# Patient Record
Sex: Female | Born: 1945
Health system: Southern US, Community
[De-identification: ages and names within clinical notes are randomized; demographics above are authoritative.]

## PROBLEM LIST (undated history)

## (undated) DIAGNOSIS — H269 Unspecified cataract: Secondary | ICD-10-CM

## (undated) DIAGNOSIS — K625 Hemorrhage of anus and rectum: Secondary | ICD-10-CM

## (undated) DIAGNOSIS — E785 Hyperlipidemia, unspecified: Secondary | ICD-10-CM

## (undated) DIAGNOSIS — R040 Epistaxis: Secondary | ICD-10-CM

## (undated) DIAGNOSIS — I10 Essential (primary) hypertension: Secondary | ICD-10-CM

## (undated) DIAGNOSIS — E119 Type 2 diabetes mellitus without complications: Secondary | ICD-10-CM

## (undated) HISTORY — DX: Epistaxis: R04.0

## (undated) HISTORY — PX: ABDOMINAL SURGERY: SHX537

## (undated) HISTORY — DX: Hyperlipidemia, unspecified: E78.5

## (undated) HISTORY — PX: ABDOMINAL HYSTERECTOMY: SHX81

## (undated) HISTORY — DX: Hemorrhage of anus and rectum: K62.5

## (undated) HISTORY — DX: Unspecified cataract: H26.9

---

## 1997-08-02 ENCOUNTER — Emergency Department (HOSPITAL_COMMUNITY): Admission: EM | Admit: 1997-08-02 | Discharge: 1997-08-02 | Payer: Self-pay | Admitting: Emergency Medicine

## 1998-11-17 ENCOUNTER — Encounter: Payer: Self-pay | Admitting: *Deleted

## 1998-11-18 ENCOUNTER — Encounter: Payer: Self-pay | Admitting: Family Medicine

## 1998-11-18 ENCOUNTER — Inpatient Hospital Stay (HOSPITAL_COMMUNITY): Admission: EM | Admit: 1998-11-18 | Discharge: 1998-11-19 | Payer: Self-pay | Admitting: *Deleted

## 1998-11-23 ENCOUNTER — Encounter: Admission: RE | Admit: 1998-11-23 | Discharge: 1998-11-23 | Payer: Self-pay | Admitting: Family Medicine

## 1998-11-24 ENCOUNTER — Emergency Department (HOSPITAL_COMMUNITY): Admission: EM | Admit: 1998-11-24 | Discharge: 1998-11-24 | Payer: Self-pay | Admitting: Emergency Medicine

## 1998-11-25 ENCOUNTER — Encounter: Admission: RE | Admit: 1998-11-25 | Discharge: 1998-11-25 | Payer: Self-pay | Admitting: Family Medicine

## 1998-12-10 ENCOUNTER — Encounter: Admission: RE | Admit: 1998-12-10 | Discharge: 1998-12-10 | Payer: Self-pay | Admitting: Family Medicine

## 1999-01-08 ENCOUNTER — Encounter (INDEPENDENT_AMBULATORY_CARE_PROVIDER_SITE_OTHER): Payer: Self-pay | Admitting: *Deleted

## 1999-01-08 LAB — CONVERTED CEMR LAB

## 1999-02-05 ENCOUNTER — Encounter: Admission: RE | Admit: 1999-02-05 | Discharge: 1999-02-05 | Payer: Self-pay | Admitting: Family Medicine

## 1999-10-01 ENCOUNTER — Encounter: Admission: RE | Admit: 1999-10-01 | Discharge: 1999-10-01 | Payer: Self-pay | Admitting: Family Medicine

## 1999-11-26 ENCOUNTER — Encounter: Admission: RE | Admit: 1999-11-26 | Discharge: 1999-11-26 | Payer: Self-pay | Admitting: Family Medicine

## 1999-12-01 ENCOUNTER — Encounter: Admission: RE | Admit: 1999-12-01 | Discharge: 1999-12-01 | Payer: Self-pay | Admitting: Family Medicine

## 1999-12-27 ENCOUNTER — Encounter: Admission: RE | Admit: 1999-12-27 | Discharge: 1999-12-27 | Payer: Self-pay | Admitting: Family Medicine

## 2000-02-14 ENCOUNTER — Other Ambulatory Visit: Admission: RE | Admit: 2000-02-14 | Discharge: 2000-02-14 | Payer: Self-pay | Admitting: Gynecology

## 2000-04-12 ENCOUNTER — Encounter: Admission: RE | Admit: 2000-04-12 | Discharge: 2000-04-12 | Payer: Self-pay | Admitting: Family Medicine

## 2000-05-26 ENCOUNTER — Encounter: Admission: RE | Admit: 2000-05-26 | Discharge: 2000-05-26 | Payer: Self-pay | Admitting: Family Medicine

## 2003-06-30 ENCOUNTER — Other Ambulatory Visit: Admission: RE | Admit: 2003-06-30 | Discharge: 2003-06-30 | Payer: Self-pay | Admitting: Gynecology

## 2004-02-08 HISTORY — PX: COLONOSCOPY: SHX174

## 2004-05-12 ENCOUNTER — Ambulatory Visit: Payer: Self-pay | Admitting: Internal Medicine

## 2004-05-26 ENCOUNTER — Ambulatory Visit: Payer: Self-pay | Admitting: Internal Medicine

## 2006-04-07 ENCOUNTER — Encounter (INDEPENDENT_AMBULATORY_CARE_PROVIDER_SITE_OTHER): Payer: Self-pay | Admitting: *Deleted

## 2008-06-25 ENCOUNTER — Encounter: Admission: RE | Admit: 2008-06-25 | Discharge: 2008-06-25 | Payer: Self-pay | Admitting: Family Medicine

## 2008-06-27 ENCOUNTER — Encounter: Admission: RE | Admit: 2008-06-27 | Discharge: 2008-06-27 | Payer: Self-pay | Admitting: Gynecology

## 2011-08-12 DIAGNOSIS — H35039 Hypertensive retinopathy, unspecified eye: Secondary | ICD-10-CM | POA: Insufficient documentation

## 2012-03-16 ENCOUNTER — Encounter (HOSPITAL_COMMUNITY): Payer: Self-pay | Admitting: Emergency Medicine

## 2012-03-16 ENCOUNTER — Emergency Department (HOSPITAL_COMMUNITY)
Admission: EM | Admit: 2012-03-16 | Discharge: 2012-03-16 | Disposition: A | Discharge status: Home / Self Care | Payer: Self-pay | Attending: Emergency Medicine | Admitting: Emergency Medicine

## 2012-03-16 DIAGNOSIS — Z79899 Other long term (current) drug therapy: Secondary | ICD-10-CM | POA: Insufficient documentation

## 2012-03-16 DIAGNOSIS — E119 Type 2 diabetes mellitus without complications: Secondary | ICD-10-CM | POA: Insufficient documentation

## 2012-03-16 DIAGNOSIS — R040 Epistaxis: Secondary | ICD-10-CM | POA: Insufficient documentation

## 2012-03-16 DIAGNOSIS — Z7982 Long term (current) use of aspirin: Secondary | ICD-10-CM | POA: Insufficient documentation

## 2012-03-16 DIAGNOSIS — R509 Fever, unspecified: Secondary | ICD-10-CM | POA: Insufficient documentation

## 2012-03-16 DIAGNOSIS — J3489 Other specified disorders of nose and nasal sinuses: Secondary | ICD-10-CM | POA: Insufficient documentation

## 2012-03-16 DIAGNOSIS — I10 Essential (primary) hypertension: Secondary | ICD-10-CM | POA: Insufficient documentation

## 2012-03-16 DIAGNOSIS — Z7901 Long term (current) use of anticoagulants: Secondary | ICD-10-CM | POA: Insufficient documentation

## 2012-03-16 HISTORY — DX: Essential (primary) hypertension: I10

## 2012-03-16 HISTORY — DX: Type 2 diabetes mellitus without complications: E11.9

## 2012-03-16 NOTE — ED Notes (Signed)
Pt reports L nostril bleeding x 30 min.  No active bleeding at this time. Pt reports cold/congestion over the past 2 weeks.

## 2012-03-16 NOTE — ED Provider Notes (Signed)
History   This chart was scribed for non-physician practitioner working with Gerhard Munch, MD by Frederik Pear, ED Scribe. This patient was seen in room TR11C/TR11C and the patient's care was started at 2147.   CSN: [id number]  Arrival date & time 03/16/12  2044   First MD Initiated Contact with Patient 03/16/12 2147      Chief Complaint  Patient presents with  . Epistaxis    (Consider location/radiation/quality/duration/timing/severity/associated sxs/prior treatment) Patient is a 67 y.o. female presenting with nosebleeds. The history is provided by the patient. No language interpreter was used.  Epistaxis  This is a new problem. The current episode started 1 to 2 hours ago. The problem occurs constantly. The problem has been gradually improving. The problem is associated with anticoagulants. She has tried a nasal tampon for the symptoms. Her past medical history is significant for sinus problems and frequent nosebleeds.    Jaidan Stachnik is a 67 y.o. female who presents to the Emergency Department complaining of sudden onset, gradually improving, constant epistaxis that began 1.5 hours ago while she was in her car. In ED, the bleeding has stopped. She also complains of left-sided congestion that began 2 weeks ago.She has a h/o of epistaxis.She denies any associated cough or fever. She has a h/o of hypertension and DM for which she takes Norvasc, Hydrodiuril, Glucophage, and Lopressor.  ENT is Dr. Haroldine Laws.   Past Medical History  Diagnosis Date  . Hypertension   . Diabetes mellitus without complication     Past Surgical History  Procedure Date  . Abdominal surgery   . Abdominal hysterectomy     No family history on file.  History  Substance Use Topics  . Smoking status: Never Smoker   . Smokeless tobacco: Not on file  . Alcohol Use: No    OB History    Grav Para Term Preterm Abortions TAB SAB Ect Mult Living                  Review of Systems   Constitutional: Positive for fever.  HENT: Positive for nosebleeds and congestion.   Respiratory: Negative for cough.   All other systems reviewed and are negative.    Allergies  Latex and Tylenol  Home Medications   Current Outpatient Rx  Name  Route  Sig  Dispense  Refill  . AMLODIPINE BESYLATE 5 MG PO TABS   Oral   Take 5 mg by mouth daily.         . ASPIRIN 325 MG PO TABS   Oral   Take 325 mg by mouth daily.         Marland Kitchen HYDROCHLOROTHIAZIDE 25 MG PO TABS   Oral   Take 25 mg by mouth daily.         Marland Kitchen METFORMIN HCL 500 MG PO TABS   Oral   Take 500 mg by mouth daily with breakfast.         . METOPROLOL TARTRATE 50 MG PO TABS   Oral   Take 50 mg by mouth 2 (two) times daily.         . ADULT MULTIVITAMIN W/MINERALS CH   Oral   Take 1 tablet by mouth daily.           BP 168/90  Pulse 76  Temp 98.2 F (36.8 C) (Oral)  Resp 16  SpO2 98%  Physical Exam  Nursing note and vitals reviewed. Constitutional: She is oriented to person, place, and time. She appears well-developed  and well-nourished. No distress.  HENT:  Head: Normocephalic and atraumatic.  Nose: Epistaxis is observed.       There is an irritated area on the medial aspect of her left nare.  Eyes: EOM are normal. Pupils are equal, round, and reactive to light.  Neck: Normal range of motion. Neck supple. No tracheal deviation present.  Cardiovascular: Normal rate.   Pulmonary/Chest: Effort normal. No respiratory distress.  Abdominal: Soft. She exhibits no distension.  Musculoskeletal: Normal range of motion. She exhibits no edema.  Neurological: She is alert and oriented to person, place, and time.  Skin: Skin is warm and dry.  Psychiatric: She has a normal mood and affect. Her behavior is normal.    ED Course  Procedures (including critical care time)  DIAGNOSTIC STUDIES: Oxygen Saturation is 98% on room air, normal by my interpretation.    COORDINATION OF CARE:  21:51- Discussed  planned course of treatment with the patient, including pinching the nose and leaning forward and using Afrin sparingly as a nasal decongestant, who is agreeable at this time.   Labs Reviewed - No data to display No results found.   No diagnosis found.  Just prior to discharge, minimal epistaxis returns.  Neo synephrine applied via saturated guaze with control of symptoms.  Patient discussed with and seen by Dr. Jeraldine Loots. MDM   I personally performed the services described in this documentation, which was scribed in my presence. The recorded information has been reviewed and is accurate.         Jimmye Norman, NP 03/17/12 1124

## 2012-03-17 ENCOUNTER — Emergency Department (HOSPITAL_COMMUNITY)
Admission: EM | Admit: 2012-03-17 | Discharge: 2012-03-17 | Disposition: A | Discharge status: Home / Self Care | Payer: Self-pay | Attending: Emergency Medicine | Admitting: Emergency Medicine

## 2012-03-17 ENCOUNTER — Encounter (HOSPITAL_COMMUNITY): Payer: Self-pay | Admitting: *Deleted

## 2012-03-17 DIAGNOSIS — E119 Type 2 diabetes mellitus without complications: Secondary | ICD-10-CM | POA: Insufficient documentation

## 2012-03-17 DIAGNOSIS — R040 Epistaxis: Secondary | ICD-10-CM | POA: Insufficient documentation

## 2012-03-17 DIAGNOSIS — I10 Essential (primary) hypertension: Secondary | ICD-10-CM | POA: Insufficient documentation

## 2012-03-17 DIAGNOSIS — Z7982 Long term (current) use of aspirin: Secondary | ICD-10-CM | POA: Insufficient documentation

## 2012-03-17 DIAGNOSIS — Z79899 Other long term (current) drug therapy: Secondary | ICD-10-CM | POA: Insufficient documentation

## 2012-03-17 NOTE — ED Notes (Signed)
Pt was seen here last night for nosebleed. Was dc home and told to use afrin, did not get prescription filled. Started bleeding again left nare. bp 174/66

## 2012-03-17 NOTE — ED Provider Notes (Signed)
History     CSN: [id number]  Arrival date & time 03/17/12  1409   First MD Initiated Contact with Patient 03/17/12 1718      Chief Complaint  Patient presents with  . Epistaxis    (Consider location/radiation/quality/duration/timing/severity/associated sxs/prior treatment) HPI Comments: Was seen last night for nosebleed.  Had packing placed and seemed to improve.  The bleeding resumed this afternoon.  No injury or trauma.    Patient is a 67 y.o. female presenting with nosebleeds. The history is provided by the patient.  Epistaxis  This is a new problem. The current episode started yesterday. The problem occurs constantly. The problem has been gradually worsening. The bleeding has been from the left nare. She has tried nothing for the symptoms. The treatment provided moderate relief.    Past Medical History  Diagnosis Date  . Hypertension   . Diabetes mellitus without complication     Past Surgical History  Procedure Laterality Date  . Abdominal surgery    . Abdominal hysterectomy      History reviewed. No pertinent family history.  History  Substance Use Topics  . Smoking status: Never Smoker   . Smokeless tobacco: Not on file  . Alcohol Use: No    OB History   Grav Para Term Preterm Abortions TAB SAB Ect Mult Living                  Review of Systems  HENT: Positive for nosebleeds.   All other systems reviewed and are negative.    Allergies  Latex and Tylenol  Home Medications   Current Outpatient Rx  Name  Route  Sig  Dispense  Refill  . amLODipine (NORVASC) 5 MG tablet   Oral   Take 5 mg by mouth daily.         Marland Kitchen aspirin 325 MG tablet   Oral   Take 325 mg by mouth daily.         . hydrochlorothiazide (HYDRODIURIL) 25 MG tablet   Oral   Take 25 mg by mouth daily.         . metFORMIN (GLUCOPHAGE) 500 MG tablet   Oral   Take 500 mg by mouth daily with breakfast.         . metoprolol (LOPRESSOR) 50 MG tablet   Oral   Take 50 mg by  mouth 2 (two) times daily.         . Multiple Vitamin (MULTIVITAMIN WITH MINERALS) TABS   Oral   Take 1 tablet by mouth daily.           BP 174/66  Pulse 81  Temp(Src) 98 F (36.7 C) (Oral)  Resp 18  SpO2 99%  Physical Exam  Nursing note and vitals reviewed. Constitutional: She is oriented to person, place, and time. She appears well-developed and well-nourished. No distress.  HENT:  Head: Normocephalic and atraumatic.  Mouth/Throat: Oropharynx is clear and moist.  The left nare has slight bleeding but I am unable to identify the area that it is originating from.  Neck: Normal range of motion. Neck supple.  Cardiovascular: Regular rhythm.   Pulmonary/Chest: Effort normal and breath sounds normal. No respiratory distress. She has no wheezes.  Abdominal: Soft. Bowel sounds are normal. She exhibits no distension. There is no tenderness.  Neurological: She is alert and oriented to person, place, and time.  Skin: Skin is warm and dry. She is not diaphoretic.    ED Course  Procedures (including critical care  time)  Labs Reviewed - No data to display No results found.   No diagnosis found.    MDM  Nasal packing performed with two rhino rockets and neosynephrine spray.  She was then observed for one hour and no further bleeding has occurred.  Will discharge to home, return in am for packing removal, prn if worsens.        Geoffery Lyons, MD 03/18/12 [phone]

## 2012-03-17 NOTE — ED Provider Notes (Signed)
  Medical screening examination/treatment/procedure(s) were performed by non-physician practitioner and as supervising physician I was immediately available for consultation/collaboration.  On my exam the patient was in no distress.  On my exam the patient had no active epistaxis  I saw the ECG (if appropriate), relevant labs and studies - I agree with the interpretation.    Gerhard Munch, MD 03/17/12 2329

## 2012-03-17 NOTE — ED Notes (Signed)
Pt stated that she came to the ED yesterday because she has been having nose bleeds x 1 week. She has had a cold and was told yesterday that she had a bursted blood vessel. They packed the patients nose and gave her Afrin for the nose bleed. She stated that yesterday was the left nasal flare, but now her right side of her nose is bleeding. No pain. Pt is currently bleeding. She is also spitting blood up. Pt is sitting upright. No respiratory distress at this time. Nose bleed cart at bedside. Will continue to monitor.

## 2012-03-17 NOTE — ED Notes (Signed)
EDP at bedside  

## 2012-03-18 ENCOUNTER — Encounter (HOSPITAL_COMMUNITY): Payer: Self-pay | Admitting: *Deleted

## 2012-03-18 ENCOUNTER — Emergency Department (HOSPITAL_COMMUNITY)
Admission: EM | Admit: 2012-03-18 | Discharge: 2012-03-18 | Disposition: A | Discharge status: Home / Self Care | Payer: Self-pay | Attending: Emergency Medicine | Admitting: Emergency Medicine

## 2012-03-18 DIAGNOSIS — Z7982 Long term (current) use of aspirin: Secondary | ICD-10-CM | POA: Insufficient documentation

## 2012-03-18 DIAGNOSIS — R040 Epistaxis: Secondary | ICD-10-CM | POA: Insufficient documentation

## 2012-03-18 DIAGNOSIS — R042 Hemoptysis: Secondary | ICD-10-CM | POA: Insufficient documentation

## 2012-03-18 DIAGNOSIS — I10 Essential (primary) hypertension: Secondary | ICD-10-CM | POA: Insufficient documentation

## 2012-03-18 DIAGNOSIS — Z79899 Other long term (current) drug therapy: Secondary | ICD-10-CM | POA: Insufficient documentation

## 2012-03-18 DIAGNOSIS — E119 Type 2 diabetes mellitus without complications: Secondary | ICD-10-CM | POA: Insufficient documentation

## 2012-03-18 LAB — CBC
MCH: 28.7 pg (ref 26.0–34.0)
MCHC: 33.7 g/dL (ref 30.0–36.0)
MCV: 85.1 fL (ref 78.0–100.0)
Platelets: 250 10*3/uL (ref 150–400)
RDW: 14.9 % (ref 11.5–15.5)

## 2012-03-18 LAB — POCT I-STAT, CHEM 8
BUN: 18 mg/dL (ref 6–23)
Calcium, Ion: 1.27 mmol/L (ref 1.13–1.30)
HCT: 38 % (ref 36.0–46.0)
Hemoglobin: 12.9 g/dL (ref 12.0–15.0)
TCO2: 34 mmol/L (ref 0–100)

## 2012-03-18 MED ORDER — CEPHALEXIN 500 MG PO CAPS: 500.0000 mg | ORAL_CAPSULE | Freq: Four times a day (QID) | ORAL | Status: DC

## 2012-03-18 NOTE — ED Provider Notes (Signed)
ENT evaluated the patient. Pt's bleed is anterior - but posterior to the area - the new packing has ceased the bleed. Pt to be started on keflex per ENT. F/u - ENT in 5 days.  Derwood Kaplan, MD 03/18/12 [phone]

## 2012-03-18 NOTE — Consult Note (Signed)
Reason for Consult: Recurrent epistaxis Referring Physician: Sunnie Nielsen, MD  HPI:  Aimee Henderson is an 67 y.o. female who presents to the [facility] emergency room this morning complaining of recurrent left-sided epistaxis. She was seen 2 days ago for sinus irritation and congestion and sent home from the Ed. She returned yesterday with epistaxis and required nasal packing at that time. Bleeding resolved in the emergency department and she was sent home. This morning she woke up spitting up blood from the back of her throat. Pt takes ASA.  No other anticoagulation medication. No previous history of sinonasal surgery.   Past Medical History  Diagnosis Date  . Hypertension   . Diabetes mellitus without complication     Past Surgical History  Procedure Laterality Date  . Abdominal surgery    . Abdominal hysterectomy      History reviewed. No pertinent family history.  Social History:  reports that she has never smoked. She does not have any smokeless tobacco history on file. She reports that she does not drink alcohol or use illicit drugs.  Allergies:  Allergies  Allergen Reactions  . Latex Hives  . Tylenol (Acetaminophen)     Liver damage     Results for orders placed during the hospital encounter of 03/18/12 (from the past 48 hour(s))  CBC     Status: None   Collection Time    03/18/12  6:18 AM      Result Value Range   WBC 9.4  4.0 - 10.5 K/uL   RBC 4.29  3.87 - 5.11 MIL/uL   Hemoglobin 12.3  12.0 - 15.0 g/dL   HCT 16.1  09.6 - 04.5 %   MCV 85.1  78.0 - 100.0 fL   MCH 28.7  26.0 - 34.0 pg   MCHC 33.7  30.0 - 36.0 g/dL   RDW 40.9  81.1 - 91.4 %   Platelets 250  150 - 400 K/uL  POCT I-STAT, CHEM 8     Status: Abnormal   Collection Time    03/18/12  6:33 AM      Result Value Range   Sodium 141  135 - 145 mEq/L   Potassium 4.4  3.5 - 5.1 mEq/L   Chloride 100  96 - 112 mEq/L   BUN 18  6 - 23 mg/dL   Creatinine, Ser 7.82  0.50 - 1.10 mg/dL   Glucose, Bld 956 (*)  70 - 99 mg/dL   Calcium, Ion 2.13  0.86 - 1.30 mmol/L   TCO2 34  0 - 100 mmol/L   Hemoglobin 12.9  12.0 - 15.0 g/dL   HCT 57.8  46.9 - 62.9 %    No results found.  Review of Systems  Constitutional: Negative for fever and chills.  HENT: Positive for nosebleeds. Negative for neck pain and neck stiffness.  Eyes: Negative for pain.  Respiratory: Negative for shortness of breath.  Cardiovascular: Negative for chest pain.  Gastrointestinal: Negative for abdominal pain.  Genitourinary: Negative for dysuria.  Musculoskeletal: Negative for back pain.  Skin: Negative for rash.  Neurological: Negative for headaches.  All other systems reviewed and are negative.   Blood pressure 154/53, pulse 72, temperature 98.8 F (37.1 C), temperature source Oral, resp. rate 17, height 5\' 5"  (1.651 m), weight 90.719 kg (200 lb), SpO2 99.00%.  Physical Exam  Constitutional: She is oriented to person, place, and time. She appears well-developed and well-nourished.  HENT:  Head: Normocephalic and atraumatic.   Nose: The previously placed  nasal packings are removed. Active bleeding is noted from the left nasal cavity. Mouth/Throat: Oropharynx has active bleeding. Eyes: EOM are normal. Pupils are equal, round, and reactive to light.  Neck: Neck supple.  Cardiovascular: Regular rhythm and intact distal pulses.  Pulmonary/Chest: Effort normal. No respiratory distress.  Musculoskeletal: Normal range of motion. She exhibits no edema.  Neurological: She is alert and oriented to person, place, and time.  Skin: Skin is warm and dry. No pallor.   Procedure: Anterior/Posterior nasal packing for control of left epistaxis. Anesthesia: Topical xylocaine and Afrin Description: The patient is placed upright in her hospital bed.  Blood clot is suctions from the left nasal cavity. Bleeding is noted from anterior and posterior left nasal cavity.Topical xylocaine and Afrin are applied. An 10 cm Merocel packing is placed  in the left. nasal cavity with good hemostasis.  The patient tolerated the procedure well.  Assessment/Plan: Recurrent left anterior/posterior epistaxis.  A AP 10cm Merocel packing is placed with good hemostasis. The patient may be discharged home with Keflex oral antibiotics. The patient will follow up in my office in 5 days for packing removal.  Dream Nodal,SUI W 03/18/2012, 8:20 AM

## 2012-03-18 NOTE — ED Provider Notes (Signed)
History     CSN: [id number]  Arrival date & time 03/18/12  0413   First MD Initiated Contact with Patient 03/18/12 [phone]      Chief Complaint  Patient presents with  . Epistaxis  . Hemoptysis    (Consider location/radiation/quality/duration/timing/severity/associated sxs/prior treatment) Patient is a 67 y.o. female presenting with nosebleeds.  Epistaxis    HX per PT. L nare epistaxis persistent despite packing placed yesterday.  She was seen 2 days ago for sinus irritation and congestion and sent home from the Ed.  She returned yesterday and required nasal packing at that time. Bleeding resolved in the emergency department and she was sent home. This morning she woke up spitting up blood nose draining of the back of her throat. Currently no active bleeding.  has been spitting up blood since being in the ER. Mild to MOD in severity , takes ASA no other blood thinners Past Medical History  Diagnosis Date  . Hypertension   . Diabetes mellitus without complication     Past Surgical History  Procedure Laterality Date  . Abdominal surgery    . Abdominal hysterectomy      History reviewed. No pertinent family history.  History  Substance Use Topics  . Smoking status: Never Smoker   . Smokeless tobacco: Not on file  . Alcohol Use: No    OB History   Grav Para Term Preterm Abortions TAB SAB Ect Mult Living                  Review of Systems  Constitutional: Negative for fever and chills.  HENT: Positive for nosebleeds. Negative for neck pain and neck stiffness.   Eyes: Negative for pain.  Respiratory: Negative for shortness of breath.   Cardiovascular: Negative for chest pain.  Gastrointestinal: Negative for abdominal pain.  Genitourinary: Negative for dysuria.  Musculoskeletal: Negative for back pain.  Skin: Negative for rash.  Neurological: Negative for headaches.  All other systems reviewed and are negative.    Allergies  Latex and Tylenol  Home Medications    Current Outpatient Rx  Name  Route  Sig  Dispense  Refill  . amLODipine (NORVASC) 5 MG tablet   Oral   Take 5 mg by mouth daily.         . hydrochlorothiazide (HYDRODIURIL) 25 MG tablet   Oral   Take 25 mg by mouth daily.         . metFORMIN (GLUCOPHAGE) 500 MG tablet   Oral   Take 500 mg by mouth daily with breakfast.         . metoprolol (LOPRESSOR) 50 MG tablet   Oral   Take 50 mg by mouth 2 (two) times daily.         . Multiple Vitamin (MULTIVITAMIN WITH MINERALS) TABS   Oral   Take 1 tablet by mouth daily.         Marland Kitchen aspirin 325 MG tablet   Oral   Take 325 mg by mouth daily.           BP 154/53  Pulse 72  Temp(Src) 98.8 F (37.1 C) (Oral)  Resp 17  Ht 5\' 5"  (1.651 m)  Wt 200 lb (90.719 kg)  BMI 33.28 kg/m2  SpO2 99%  Physical Exam  Constitutional: She is oriented to person, place, and time. She appears well-developed and well-nourished.  HENT:  Head: Normocephalic and atraumatic.  Mouth/Throat: Oropharynx is clear and moist. No oropharyngeal exudate.  L nare packing in  place no anterior bleeding  Eyes: EOM are normal. Pupils are equal, round, and reactive to light.  Neck: Neck supple.  Cardiovascular: Regular rhythm and intact distal pulses.   Pulmonary/Chest: Effort normal. No respiratory distress.  Musculoskeletal: Normal range of motion. She exhibits no edema.  Neurological: She is alert and oriented to person, place, and time.  Skin: Skin is warm and dry. No pallor.    ED Course  Procedures (including critical care time)  Results for orders placed during the hospital encounter of 03/18/12  CBC      Result Value Range   WBC 9.4  4.0 - 10.5 K/uL   RBC 4.29  3.87 - 5.11 MIL/uL   Hemoglobin 12.3  12.0 - 15.0 g/dL   HCT 96.0  45.4 - 09.8 %   MCV 85.1  78.0 - 100.0 fL   MCH 28.7  26.0 - 34.0 pg   MCHC 33.7  30.0 - 36.0 g/dL   RDW 11.9  14.7 - 82.9 %   Platelets 250  150 - 400 K/uL  POCT I-STAT, CHEM 8      Result Value Range    Sodium 141  135 - 145 mEq/L   Potassium 4.4  3.5 - 5.1 mEq/L   Chloride 100  96 - 112 mEq/L   BUN 18  6 - 23 mg/dL   Creatinine, Ser 5.62  0.50 - 1.10 mg/dL   Glucose, Bld 130 (*) 70 - 99 mg/dL   Calcium, Ion 8.65  7.84 - 1.30 mmol/L   TCO2 34  0 - 100 mmol/L   Hemoglobin 12.9  12.0 - 15.0 g/dL   HCT 69.6  29.5 - 28.4 %   0645 d/w DR Suszanne Conners - he will evaluate PT in ED - Merocel packing bedside with ENT cart   MDM  Recurrent epistaxis  Labs reviewed  ENT consulted/ plan ENT dispo        Sunnie Nielsen, MD 03/18/12 [phone]

## 2012-03-18 NOTE — ED Notes (Signed)
Pt states that she has had 2 nose bleed recently. Pt states that she had a rhino placed and was told to come in today and have it removed. Pt states this morning around 04:00 she started coughing up a clot of blood. Pt states that her spit is still pink. Pt denies HA or pain.

## 2012-07-05 ENCOUNTER — Ambulatory Visit (INDEPENDENT_AMBULATORY_CARE_PROVIDER_SITE_OTHER): Payer: Medicare HMO | Admitting: Otolaryngology

## 2012-07-05 DIAGNOSIS — R040 Epistaxis: Secondary | ICD-10-CM

## 2012-07-05 DIAGNOSIS — J0100 Acute maxillary sinusitis, unspecified: Secondary | ICD-10-CM

## 2012-08-24 ENCOUNTER — Other Ambulatory Visit: Payer: Self-pay | Admitting: Family Medicine

## 2012-08-24 ENCOUNTER — Ambulatory Visit
Admission: RE | Admit: 2012-08-24 | Discharge: 2012-08-24 | Disposition: A | Discharge status: Home / Self Care | Payer: Medicare HMO | Source: Ambulatory Visit | Attending: Family Medicine | Admitting: Family Medicine

## 2012-08-24 DIAGNOSIS — M25511 Pain in right shoulder: Secondary | ICD-10-CM

## 2013-03-01 ENCOUNTER — Other Ambulatory Visit: Payer: Self-pay | Admitting: Orthopedic Surgery

## 2013-03-01 ENCOUNTER — Other Ambulatory Visit: Payer: Medicare HMO

## 2013-03-01 DIAGNOSIS — M25511 Pain in right shoulder: Secondary | ICD-10-CM

## 2013-03-04 ENCOUNTER — Other Ambulatory Visit: Payer: Medicare HMO

## 2013-03-07 ENCOUNTER — Ambulatory Visit
Admission: RE | Admit: 2013-03-07 | Discharge: 2013-03-07 | Disposition: A | Discharge status: Home / Self Care | Payer: Medicare HMO | Source: Ambulatory Visit | Attending: Orthopedic Surgery | Admitting: Orthopedic Surgery

## 2013-03-07 DIAGNOSIS — M25511 Pain in right shoulder: Secondary | ICD-10-CM

## 2013-07-11 DIAGNOSIS — E119 Type 2 diabetes mellitus without complications: Secondary | ICD-10-CM | POA: Insufficient documentation

## 2013-08-26 ENCOUNTER — Ambulatory Visit (INDEPENDENT_AMBULATORY_CARE_PROVIDER_SITE_OTHER): Payer: Commercial Managed Care - HMO | Admitting: Podiatry

## 2013-08-26 ENCOUNTER — Encounter: Payer: Self-pay | Admitting: Podiatry

## 2013-08-26 DIAGNOSIS — M79609 Pain in unspecified limb: Secondary | ICD-10-CM

## 2013-08-26 DIAGNOSIS — M79673 Pain in unspecified foot: Secondary | ICD-10-CM

## 2013-08-26 DIAGNOSIS — B351 Tinea unguium: Secondary | ICD-10-CM

## 2013-08-26 NOTE — Patient Instructions (Signed)
Diabetes and Foot Care Diabetes may cause you to have problems because of poor blood supply (circulation) to your feet and legs. This may cause the skin on your feet to become thinner, break easier, and heal more slowly. Your skin may become dry, and the skin may peel and crack. You may also have nerve damage in your legs and feet causing decreased feeling in them. You may not notice minor injuries to your feet that could lead to infections or more serious problems. Taking care of your feet is one of the most important things you can do for yourself.  HOME CARE INSTRUCTIONS  Wear shoes at all times, even in the house. Do not go barefoot. Bare feet are easily injured.  Check your feet daily for blisters, cuts, and redness. If you cannot see the bottom of your feet, use a mirror or ask someone for help.  Wash your feet with warm water (do not use hot water) and mild soap. Then pat your feet and the areas between your toes until they are completely dry. Do not soak your feet as this can dry your skin.  Apply a moisturizing lotion or petroleum jelly (that does not contain alcohol and is unscented) to the skin on your feet and to dry, brittle toenails. Do not apply lotion between your toes.  Trim your toenails straight across. Do not dig under them or around the cuticle. File the edges of your nails with an emery board or nail file.  Do not cut corns or calluses or try to remove them with medicine.  Wear clean socks or stockings every day. Make sure they are not too tight. Do not wear knee-high stockings since they may decrease blood flow to your legs.  Wear shoes that fit properly and have enough cushioning. To break in new shoes, wear them for just a few hours a day. This prevents you from injuring your feet. Always look in your shoes before you put them on to be sure there are no objects inside.  Do not cross your legs. This may decrease the blood flow to your feet.  If you find a minor scrape,  cut, or break in the skin on your feet, keep it and the skin around it clean and dry. These areas may be cleansed with mild soap and water. Do not cleanse the area with peroxide, alcohol, or iodine.  When you remove an adhesive bandage, be sure not to damage the skin around it.  If you have a wound, look at it several times a day to make sure it is healing.  Do not use heating pads or hot water bottles. They may burn your skin. If you have lost feeling in your feet or legs, you may not know it is happening until it is too late.  Make sure your health care provider performs a complete foot exam at least annually or more often if you have foot problems. Report any cuts, sores, or bruises to your health care provider immediately. SEEK MEDICAL CARE IF:   You have an injury that is not healing.  You have cuts or breaks in the skin.  You have an ingrown nail.  You notice redness on your legs or feet.  You feel burning or tingling in your legs or feet.  You have pain or cramps in your legs and feet.  Your legs or feet are numb.  Your feet always feel cold. SEEK IMMEDIATE MEDICAL CARE IF:   There is increasing redness,   swelling, or pain in or around a wound.  There is a red line that goes up your leg.  Pus is coming from a wound.  You develop a fever or as directed by your health care provider.  You notice a bad smell coming from an ulcer or wound. Document Released: 01/22/2000 Document Revised: 09/26/2012 Document Reviewed: 07/03/2012 Exit[employer] Patient Information 2015 [employer]. This information is not intended to replace advice given to you by your health care provider. Make sure you discuss any questions you have with your health care provider.  

## 2013-08-28 NOTE — Progress Notes (Signed)
Subjective:     Patient ID: Aimee Henderson, female   DOB: 1945/07/19, 68 y.o.   MRN: [id number]  HPI patient presents with nail disease that are E. long gaited thick and yellow with inability for her to cut herself and are painful  Review of Systems     Objective:   Physical Exam Neurovascular status intact with thick yellow brittle nailbeds 1-5 both feet that are painful    Assessment:     Mycotic nail infection is with pain 1-5 both feet    Plan:     Debris painful nailbeds 1-5 both feet with no iatrogenic bleeding noted

## 2013-11-25 ENCOUNTER — Ambulatory Visit: Payer: Commercial Managed Care - HMO | Admitting: Podiatry

## 2014-02-27 DIAGNOSIS — Z6834 Body mass index (BMI) 34.0-34.9, adult: Secondary | ICD-10-CM | POA: Diagnosis not present

## 2014-02-27 DIAGNOSIS — Z9181 History of falling: Secondary | ICD-10-CM | POA: Diagnosis not present

## 2014-02-27 DIAGNOSIS — E669 Obesity, unspecified: Secondary | ICD-10-CM | POA: Diagnosis not present

## 2014-02-27 DIAGNOSIS — Z7982 Long term (current) use of aspirin: Secondary | ICD-10-CM | POA: Diagnosis not present

## 2014-02-27 DIAGNOSIS — E785 Hyperlipidemia, unspecified: Secondary | ICD-10-CM | POA: Diagnosis not present

## 2014-02-27 DIAGNOSIS — I10 Essential (primary) hypertension: Secondary | ICD-10-CM | POA: Diagnosis not present

## 2014-02-27 DIAGNOSIS — E119 Type 2 diabetes mellitus without complications: Secondary | ICD-10-CM | POA: Diagnosis not present

## 2014-04-09 ENCOUNTER — Encounter: Payer: Self-pay | Admitting: Internal Medicine

## 2014-05-11 ENCOUNTER — Encounter (HOSPITAL_COMMUNITY): Payer: Self-pay

## 2014-05-11 ENCOUNTER — Emergency Department (HOSPITAL_COMMUNITY)
Admission: EM | Admit: 2014-05-11 | Discharge: 2014-05-11 | Disposition: A | Discharge status: Home / Self Care | Payer: No Typology Code available for payment source | Attending: Emergency Medicine | Admitting: Emergency Medicine

## 2014-05-11 DIAGNOSIS — E119 Type 2 diabetes mellitus without complications: Secondary | ICD-10-CM | POA: Insufficient documentation

## 2014-05-11 DIAGNOSIS — Y9389 Activity, other specified: Secondary | ICD-10-CM | POA: Insufficient documentation

## 2014-05-11 DIAGNOSIS — F419 Anxiety disorder, unspecified: Secondary | ICD-10-CM | POA: Diagnosis not present

## 2014-05-11 DIAGNOSIS — Y998 Other external cause status: Secondary | ICD-10-CM | POA: Diagnosis not present

## 2014-05-11 DIAGNOSIS — Z043 Encounter for examination and observation following other accident: Secondary | ICD-10-CM | POA: Diagnosis not present

## 2014-05-11 DIAGNOSIS — Z041 Encounter for examination and observation following transport accident: Secondary | ICD-10-CM | POA: Diagnosis not present

## 2014-05-11 DIAGNOSIS — Z7982 Long term (current) use of aspirin: Secondary | ICD-10-CM | POA: Insufficient documentation

## 2014-05-11 DIAGNOSIS — Z91040 Latex allergy status: Secondary | ICD-10-CM | POA: Diagnosis not present

## 2014-05-11 DIAGNOSIS — Z79899 Other long term (current) drug therapy: Secondary | ICD-10-CM | POA: Insufficient documentation

## 2014-05-11 DIAGNOSIS — Y92410 Unspecified street and highway as the place of occurrence of the external cause: Secondary | ICD-10-CM | POA: Diagnosis not present

## 2014-05-11 DIAGNOSIS — I10 Essential (primary) hypertension: Secondary | ICD-10-CM | POA: Diagnosis not present

## 2014-05-11 MED ORDER — IBUPROFEN 400 MG PO TABS: 400.0000 mg | ORAL_TABLET | Freq: Four times a day (QID) | ORAL | Status: AC | PRN
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
## 2014-07-17 ENCOUNTER — Other Ambulatory Visit: Payer: Self-pay

## 2014-07-17 DIAGNOSIS — Z1231 Encounter for screening mammogram for malignant neoplasm of breast: Secondary | ICD-10-CM

## 2014-07-18 ENCOUNTER — Ambulatory Visit
Admission: RE | Admit: 2014-07-18 | Discharge: 2014-07-18 | Disposition: A | Discharge status: Home / Self Care | Payer: Commercial Managed Care - HMO | Source: Ambulatory Visit

## 2014-07-18 DIAGNOSIS — Z1231 Encounter for screening mammogram for malignant neoplasm of breast: Secondary | ICD-10-CM | POA: Diagnosis not present

## 2014-07-22 ENCOUNTER — Other Ambulatory Visit: Payer: Self-pay | Admitting: Family Medicine

## 2014-07-22 DIAGNOSIS — R0989 Other specified symptoms and signs involving the circulatory and respiratory systems: Secondary | ICD-10-CM | POA: Diagnosis not present

## 2014-07-22 DIAGNOSIS — E119 Type 2 diabetes mellitus without complications: Secondary | ICD-10-CM | POA: Diagnosis not present

## 2014-07-22 DIAGNOSIS — E785 Hyperlipidemia, unspecified: Secondary | ICD-10-CM | POA: Diagnosis not present

## 2014-07-22 DIAGNOSIS — I10 Essential (primary) hypertension: Secondary | ICD-10-CM | POA: Diagnosis not present

## 2014-07-22 DIAGNOSIS — R928 Other abnormal and inconclusive findings on diagnostic imaging of breast: Secondary | ICD-10-CM

## 2014-07-22 DIAGNOSIS — E1165 Type 2 diabetes mellitus with hyperglycemia: Secondary | ICD-10-CM | POA: Diagnosis not present

## 2014-07-24 ENCOUNTER — Ambulatory Visit
Admission: RE | Admit: 2014-07-24 | Discharge: 2014-07-24 | Disposition: A | Discharge status: Home / Self Care | Payer: Commercial Managed Care - HMO | Source: Ambulatory Visit | Attending: Family Medicine | Admitting: Family Medicine

## 2014-07-24 ENCOUNTER — Other Ambulatory Visit: Payer: Self-pay | Admitting: Family Medicine

## 2014-07-24 DIAGNOSIS — R928 Other abnormal and inconclusive findings on diagnostic imaging of breast: Secondary | ICD-10-CM

## 2014-07-24 DIAGNOSIS — R921 Mammographic calcification found on diagnostic imaging of breast: Secondary | ICD-10-CM | POA: Diagnosis not present

## 2014-07-25 ENCOUNTER — Other Ambulatory Visit: Payer: Self-pay | Admitting: Family Medicine

## 2014-07-25 DIAGNOSIS — R928 Other abnormal and inconclusive findings on diagnostic imaging of breast: Secondary | ICD-10-CM

## 2014-07-28 ENCOUNTER — Ambulatory Visit
Admission: RE | Admit: 2014-07-28 | Discharge: 2014-07-28 | Disposition: A | Discharge status: Home / Self Care | Payer: Commercial Managed Care - HMO | Source: Ambulatory Visit | Attending: Family Medicine | Admitting: Family Medicine

## 2014-07-28 DIAGNOSIS — R0989 Other specified symptoms and signs involving the circulatory and respiratory systems: Secondary | ICD-10-CM | POA: Diagnosis not present

## 2014-07-31 ENCOUNTER — Ambulatory Visit
Admission: RE | Admit: 2014-07-31 | Discharge: 2014-07-31 | Disposition: A | Discharge status: Home / Self Care | Payer: Commercial Managed Care - HMO | Source: Ambulatory Visit | Attending: Family Medicine | Admitting: Family Medicine

## 2014-07-31 DIAGNOSIS — R928 Other abnormal and inconclusive findings on diagnostic imaging of breast: Secondary | ICD-10-CM

## 2014-07-31 DIAGNOSIS — R921 Mammographic calcification found on diagnostic imaging of breast: Secondary | ICD-10-CM | POA: Diagnosis not present

## 2014-07-31 DIAGNOSIS — D242 Benign neoplasm of left breast: Secondary | ICD-10-CM | POA: Diagnosis not present

## 2014-07-31 HISTORY — PX: BREAST BIOPSY: SHX20

## 2014-08-28 DIAGNOSIS — H2513 Age-related nuclear cataract, bilateral: Secondary | ICD-10-CM | POA: Insufficient documentation

## 2014-08-28 DIAGNOSIS — E119 Type 2 diabetes mellitus without complications: Secondary | ICD-10-CM | POA: Diagnosis not present

## 2014-09-30 ENCOUNTER — Encounter: Payer: Self-pay | Admitting: Podiatry

## 2014-09-30 ENCOUNTER — Ambulatory Visit (INDEPENDENT_AMBULATORY_CARE_PROVIDER_SITE_OTHER): Payer: Commercial Managed Care - HMO | Admitting: Podiatry

## 2014-09-30 DIAGNOSIS — M79673 Pain in unspecified foot: Secondary | ICD-10-CM | POA: Diagnosis not present

## 2014-09-30 DIAGNOSIS — B351 Tinea unguium: Secondary | ICD-10-CM

## 2014-09-30 NOTE — Progress Notes (Addendum)
Patient ID: Aimee Henderson, female   DOB: Jun 30, 1945, 69 y.o.   MRN: [id number] Complaint:  Visit Type: Patient returns to my office for continued preventative foot care services. Complaint: Patient states" my nails have grown long and thick and become painful to walk and wear shoes" Patient has been diagnosed with DM with no foot complications. The patient presents for preventative foot care services. No changes to ROS  Podiatric Exam: Vascular: dorsalis pedis and posterior tibial pulses are palpable bilateral. Capillary return is immediate. Temperature gradient is WNL. Skin turgor WNL  Sensorium: Normal Semmes Weinstein monofilament test. Normal tactile sensation bilaterally. Nail Exam: Pt has thick disfigured discolored nails with subungual debris noted bilateral entire nail hallux through fifth toenails Ulcer Exam: There is no evidence of ulcer or pre-ulcerative changes or infection. Orthopedic Exam: Muscle tone and strength are WNL. No limitations in general ROM. No crepitus or effusions noted. Foot type and digits show no abnormalities. Bony prominences are unremarkable. Plantar fibroma left foot. Skin: No Porokeratosis. No infection or ulcers  Diagnosis:  Onychomycosis, , Pain in right toe, pain in left toes  Treatment & Plan Procedures and Treatment: Consent by patient was obtained for treatment procedures. The patient understood the discussion of treatment and procedures well. All questions were answered thoroughly reviewed. Debridement of mycotic and hypertrophic toenails, 1 through 5 bilateral and clearing of subungual debris. No ulceration, no infection noted.  Return Visit-Office Procedure: Patient instructed to return to the office for a follow up visit 3 months for continued evaluation and treatment.

## 2014-10-31 ENCOUNTER — Encounter: Payer: Self-pay | Admitting: Internal Medicine

## 2015-01-28 DIAGNOSIS — E119 Type 2 diabetes mellitus without complications: Secondary | ICD-10-CM | POA: Diagnosis not present

## 2015-01-28 DIAGNOSIS — Z0000 Encounter for general adult medical examination without abnormal findings: Secondary | ICD-10-CM | POA: Diagnosis not present

## 2015-01-28 DIAGNOSIS — E785 Hyperlipidemia, unspecified: Secondary | ICD-10-CM | POA: Diagnosis not present

## 2015-01-28 DIAGNOSIS — Z23 Encounter for immunization: Secondary | ICD-10-CM | POA: Diagnosis not present

## 2015-01-28 DIAGNOSIS — I10 Essential (primary) hypertension: Secondary | ICD-10-CM | POA: Diagnosis not present

## 2015-01-28 DIAGNOSIS — R109 Unspecified abdominal pain: Secondary | ICD-10-CM | POA: Diagnosis not present

## 2015-01-28 DIAGNOSIS — Z1211 Encounter for screening for malignant neoplasm of colon: Secondary | ICD-10-CM | POA: Diagnosis not present

## 2015-02-06 ENCOUNTER — Encounter: Payer: Self-pay | Admitting: Gastroenterology

## 2015-02-08 HISTORY — PX: COLONOSCOPY: SHX174

## 2015-02-08 HISTORY — PX: POLYPECTOMY: SHX149

## 2015-02-17 ENCOUNTER — Ambulatory Visit: Payer: Commercial Managed Care - HMO | Admitting: Podiatry

## 2015-03-11 DIAGNOSIS — K625 Hemorrhage of anus and rectum: Secondary | ICD-10-CM

## 2015-03-11 HISTORY — DX: Hemorrhage of anus and rectum: K62.5

## 2015-03-24 ENCOUNTER — Ambulatory Visit (AMBULATORY_SURGERY_CENTER): Payer: Self-pay | Admitting: *Deleted

## 2015-03-24 VITALS — Ht 65.0 in | Wt 207.0 lb

## 2015-03-24 DIAGNOSIS — Z1211 Encounter for screening for malignant neoplasm of colon: Secondary | ICD-10-CM

## 2015-03-24 MED ORDER — NA SULFATE-K SULFATE-MG SULF 17.5-3.13-1.6 GM/177ML PO SOLN: 1.0000 | Freq: Once | ORAL | Status: DC

## 2015-03-24 NOTE — Progress Notes (Signed)
Denies allergies to eggs or soy products. Denies complications with sedation or anesthesia. Denies O2 use. Denies use of diet or weight loss medications.  Emmi instructions given for colonoscopy.  

## 2015-04-07 ENCOUNTER — Encounter: Payer: Self-pay | Admitting: Gastroenterology

## 2015-04-07 ENCOUNTER — Ambulatory Visit (AMBULATORY_SURGERY_CENTER): Payer: Commercial Managed Care - HMO | Admitting: Gastroenterology

## 2015-04-07 VITALS — BP 104/56 | HR 56 | Temp 97.1°F | Resp 14 | Ht 65.0 in | Wt 207.0 lb

## 2015-04-07 DIAGNOSIS — Z1211 Encounter for screening for malignant neoplasm of colon: Secondary | ICD-10-CM

## 2015-04-07 DIAGNOSIS — D123 Benign neoplasm of transverse colon: Secondary | ICD-10-CM

## 2015-04-07 DIAGNOSIS — E119 Type 2 diabetes mellitus without complications: Secondary | ICD-10-CM | POA: Diagnosis not present

## 2015-04-07 LAB — GLUCOSE, CAPILLARY
GLUCOSE-CAPILLARY: 123 mg/dL — AB (ref 65–99)
GLUCOSE-CAPILLARY: 114 mg/dL — AB (ref 65–99)

## 2015-04-07 MED ORDER — SODIUM CHLORIDE 0.9 % IV SOLN: 500.0000 mL | INTRAVENOUS | Status: DC

## 2015-04-07 NOTE — Progress Notes (Signed)
Called to room to assist during endoscopic procedure.  Patient ID and intended procedure confirmed with present staff. Received instructions for my participation in the procedure from the performing physician.  

## 2015-04-07 NOTE — Progress Notes (Signed)
A/ox3 pleased with MAC, report to Jill RN 

## 2015-04-07 NOTE — Patient Instructions (Signed)
YOU HAD AN ENDOSCOPIC PROCEDURE TODAY AT [facility]:   Refer to the procedure report that was given to you for any specific questions about what was found during the examination.  If the procedure report does not answer your questions, please call your gastroenterologist to clarify.  If you requested that your care partner not be given the details of your procedure findings, then the procedure report has been included in a sealed envelope for you to review at your convenience later.  YOU SHOULD EXPECT: Some feelings of bloating in the abdomen. Passage of more gas than usual.  Walking can help get rid of the air that was put into your GI tract during the procedure and reduce the bloating. If you had a lower endoscopy (such as a colonoscopy or flexible sigmoidoscopy) you may notice spotting of blood in your stool or on the toilet paper. If you underwent a bowel prep for your procedure, you may not have a normal bowel movement for a few days.  Please Note:  You might notice some irritation and congestion in your nose or some drainage.  This is from the oxygen used during your procedure.  There is no need for concern and it should clear up in a day or so.  SYMPTOMS TO REPORT IMMEDIATELY:   Following lower endoscopy (colonoscopy or flexible sigmoidoscopy):  Excessive amounts of blood in the stool  Significant tenderness or worsening of abdominal pains  Swelling of the abdomen that is new, acute  Fever of 100F or higher   For urgent or emergent issues, a gastroenterologist can be reached at any hour by calling [phone].   DIET: Your first meal following the procedure should be a small meal and then it is ok to progress to your normal diet. Heavy or fried foods are harder to digest and may make you feel nauseous or bloated.  Likewise, meals heavy in dairy and vegetables can increase bloating.  Drink plenty of fluids but you should avoid alcoholic beverages for 24  hours.  ACTIVITY:  You should plan to take it easy for the rest of today and you should NOT DRIVE or use heavy machinery until tomorrow (because of the sedation medicines used during the test).    FOLLOW UP: Our staff will call the number listed on your records the next business day following your procedure to check on you and address any questions or concerns that you may have regarding the information given to you following your procedure. If we do not reach you, we will leave a message.  However, if you are feeling well and you are not experiencing any problems, there is no need to return our call.  We will assume that you have returned to your regular daily activities without incident.  If any biopsies were taken you will be contacted by phone or by letter within the next 1-3 weeks.  Please call us at [phone] if you have not heard about the biopsies in 3 weeks.    SIGNATURES/CONFIDENTIALITY: You and/or your care partner have signed paperwork which will be entered into your electronic medical record.  These signatures attest to the fact that that the information above on your After Visit Summary has been reviewed and is understood.  Full responsibility of the confidentiality of this discharge information lies with you and/or your care-partner.  Polyp/ hemorrhoid handout given Await pathology results Resume regular medications and diet

## 2015-04-07 NOTE — Op Note (Signed)
[facility]  [address] [place], [id number]   COLONOSCOPY PROCEDURE REPORT  PATIENT: Aimee Henderson, Aimee Henderson  MR#: [id number] BIRTHDATE: 1945-10-10 , 23  yrs. old GENDER: female ENDOSCOPIST: Harl Bowie, MD REFERRED [id number] Dema Severin, M.D. PROCEDURE DATE:  04/07/2015 PROCEDURE:   Colonoscopy, screening and Colonoscopy with snare polypectomy First Screening Colonoscopy - Avg.  risk and is 50 yrs.  old or older - No.  Prior Negative Screening - Now for repeat screening. 10 or more years since last screening  History of Adenoma - Now for follow-up colonoscopy & has been > or = to 3 yrs.  N/A  Polyps removed today? Yes ASA CLASS:   Class II INDICATIONS:Screening for colonic neoplasia and Colorectal Neoplasm Risk Assessment for this procedure is average risk. MEDICATIONS: Propofol 150 mg IV  DESCRIPTION OF PROCEDURE:   After the risks benefits and alternatives of the procedure were thoroughly explained, informed consent was obtained.  The digital rectal exam revealed no abnormalities of the rectum.   The LB [id number]  endoscope was introduced through the anus and advanced to the cecum, which was identified by both the appendix and ileocecal valve. No adverse events experienced.   The quality of the prep was good.  The instrument was then slowly withdrawn as the colon was fully examined. Estimated blood loss is zero unless otherwise noted in this procedure report.   COLON FINDINGS: A sessile polyp ranging between 5-1mm in size was found in the transverse colon.  A polypectomy was performed with a cold snare.  The resection was complete, the polyp tissue was completely retrieved and sent to histology.   Moderate sized external and internal hemorrhoids were found.  Retroflexed views revealed internal hemorrhoids. The time to cecum = 4.1 Withdrawal time = 10.7   The scope was withdrawn and the procedure completed. COMPLICATIONS: There were no immediate  complications.  ENDOSCOPIC IMPRESSION: 1.   Sessile polyp ranging between 5-65mm in size was found in the transverse colon; polypectomy was performed with a cold snare 2.   Moderate sized external and internal hemorrhoids  RECOMMENDATIONS: 1.  Await pathology results 2.  If the polyp(s) removed today are proven to be adenomatous (pre-cancerous) polyps, you will need a repeat colonoscopy in 5 years.  Otherwise you should continue to follow colorectal cancer screening guidelines for "routine risk" patients with colonoscopy in 10 years.  You will receive a letter within 1-2 weeks with the results of your biopsy as well as final recommendations.  Please call my office if you have not received a letter after 3 weeks.  eSigned:  Harl Bowie, MD 04/07/2015 10:20 AM

## 2015-04-08 ENCOUNTER — Telehealth: Payer: Self-pay

## 2015-04-08 NOTE — Telephone Encounter (Signed)
  Follow up Call-  Call back number 04/07/2015  Post procedure Call Back phone  # [phone]  Permission to leave phone message Yes     Patient questions:  Do you have a fever, pain , or abdominal swelling? No. Pain Score  0 *  Have you tolerated food without any problems? Yes.    Have you been able to return to your normal activities? Yes.    Do you have any questions about your discharge instructions: Diet   No. Medications  No. Follow up visit  No.  Do you have questions or concerns about your Care? No.  Actions: * If pain score is 4 or above: No action needed, pain <4.

## 2015-04-14 ENCOUNTER — Encounter: Payer: Self-pay | Admitting: Gastroenterology

## 2015-07-21 ENCOUNTER — Encounter: Payer: Self-pay | Admitting: Podiatry

## 2015-07-21 ENCOUNTER — Ambulatory Visit (INDEPENDENT_AMBULATORY_CARE_PROVIDER_SITE_OTHER): Payer: Commercial Managed Care - HMO | Admitting: Podiatry

## 2015-07-21 DIAGNOSIS — M79673 Pain in unspecified foot: Secondary | ICD-10-CM | POA: Diagnosis not present

## 2015-07-21 DIAGNOSIS — B351 Tinea unguium: Secondary | ICD-10-CM | POA: Diagnosis not present

## 2015-07-21 NOTE — Progress Notes (Signed)
Patient ID: Aimee Henderson, female   DOB: 1945-12-22, 70 y.o.   MRN: [id number] Complaint:  Visit Type: Patient returns to my office for continued preventative foot care services. Complaint: Patient states" my nails have grown long and thick and become painful to walk and wear shoes" Patient has been diagnosed with DM with no foot complications. The patient presents for preventative foot care services. No changes to ROS  Podiatric Exam: Vascular: dorsalis pedis and posterior tibial pulses are palpable bilateral. Capillary return is immediate. Temperature gradient is WNL. Skin turgor WNL  Sensorium: Normal Semmes Weinstein monofilament test. Normal tactile sensation bilaterally. Nail Exam: Pt has thick disfigured discolored nails with subungual debris noted bilateral entire nail hallux through fifth toenails Ulcer Exam: There is no evidence of ulcer or pre-ulcerative changes or infection. Orthopedic Exam: Muscle tone and strength are WNL. No limitations in general ROM. No crepitus or effusions noted. Foot type and digits show no abnormalities. Bony prominences are unremarkable.  Skin: No Porokeratosis. No infection or ulcers  Diagnosis:  Onychomycosis, , Pain in right toe, pain in left toes  Treatment & Plan Procedures and Treatment: Consent by patient was obtained for treatment procedures. The patient understood the discussion of treatment and procedures well. All questions were answered thoroughly reviewed. Debridement of mycotic and hypertrophic toenails, 1 through 5 bilateral and clearing of subungual debris. No ulceration, no infection noted.  Return Visit-Office Procedure: Patient instructed to return to the office for a follow up visit 4 months for continued evaluation and treatment.  Gardiner Barefoot DPM

## 2015-07-28 DIAGNOSIS — M79604 Pain in right leg: Secondary | ICD-10-CM | POA: Diagnosis not present

## 2015-07-28 DIAGNOSIS — E785 Hyperlipidemia, unspecified: Secondary | ICD-10-CM | POA: Diagnosis not present

## 2015-07-28 DIAGNOSIS — Z23 Encounter for immunization: Secondary | ICD-10-CM | POA: Diagnosis not present

## 2015-07-28 DIAGNOSIS — I10 Essential (primary) hypertension: Secondary | ICD-10-CM | POA: Diagnosis not present

## 2015-07-28 DIAGNOSIS — E119 Type 2 diabetes mellitus without complications: Secondary | ICD-10-CM | POA: Diagnosis not present

## 2015-07-31 ENCOUNTER — Ambulatory Visit: Payer: Commercial Managed Care - HMO | Admitting: Podiatry

## 2015-09-02 DIAGNOSIS — E119 Type 2 diabetes mellitus without complications: Secondary | ICD-10-CM | POA: Diagnosis not present

## 2015-11-24 ENCOUNTER — Ambulatory Visit: Payer: Commercial Managed Care - HMO | Admitting: Podiatry

## 2015-12-18 ENCOUNTER — Encounter: Payer: Self-pay | Admitting: Podiatry

## 2015-12-18 ENCOUNTER — Ambulatory Visit (INDEPENDENT_AMBULATORY_CARE_PROVIDER_SITE_OTHER): Payer: Commercial Managed Care - HMO | Admitting: Podiatry

## 2015-12-18 DIAGNOSIS — M79676 Pain in unspecified toe(s): Secondary | ICD-10-CM | POA: Diagnosis not present

## 2015-12-18 DIAGNOSIS — B351 Tinea unguium: Secondary | ICD-10-CM | POA: Diagnosis not present

## 2015-12-18 NOTE — Progress Notes (Signed)
Patient ID: Aimee Henderson, female   DOB: [birth year], 70 y.o.   MRN: [id number] Complaint:  Visit Type: Patient returns to my office for continued preventative foot care services. Complaint: Patient states" my nails have grown long and thick and become painful to walk and wear shoes" Patient has been diagnosed with DM with no foot complications. The patient presents for preventative foot care services. No changes to ROS  Podiatric Exam: Vascular: dorsalis pedis and posterior tibial pulses are palpable bilateral. Capillary return is immediate. Temperature gradient is WNL. Skin turgor WNL  Sensorium: Normal Semmes Weinstein monofilament test. Normal tactile sensation bilaterally. Nail Exam: Pt has thick disfigured discolored nails with subungual debris noted bilateral entire nail hallux through fifth toenails Ulcer Exam: There is no evidence of ulcer or pre-ulcerative changes or infection. Orthopedic Exam: Muscle tone and strength are WNL. No limitations in general ROM. No crepitus or effusions noted. Foot type and digits show no abnormalities. Bony prominences are unremarkable. Plantar fibroma left foot. Skin: No Porokeratosis. No infection or ulcers  Diagnosis:  Onychomycosis, , Pain in right toe, pain in left toes  Treatment & Plan Procedures and Treatment: Consent by patient was obtained for treatment procedures. The patient understood the discussion of treatment and procedures well. All questions were answered thoroughly reviewed. Debridement of mycotic and hypertrophic toenails, 1 through 5 bilateral and clearing of subungual debris. No ulceration, no infection noted.  Return Visit-Office Procedure: Patient instructed to return to the office for a follow up visit 4 months for continued evaluation and treatment.    Gardiner Barefoot DPM

## 2016-03-09 DIAGNOSIS — E785 Hyperlipidemia, unspecified: Secondary | ICD-10-CM | POA: Diagnosis not present

## 2016-03-09 DIAGNOSIS — Z23 Encounter for immunization: Secondary | ICD-10-CM | POA: Diagnosis not present

## 2016-03-09 DIAGNOSIS — E119 Type 2 diabetes mellitus without complications: Secondary | ICD-10-CM | POA: Diagnosis not present

## 2016-03-09 DIAGNOSIS — I10 Essential (primary) hypertension: Secondary | ICD-10-CM | POA: Diagnosis not present

## 2016-03-09 DIAGNOSIS — Z7984 Long term (current) use of oral hypoglycemic drugs: Secondary | ICD-10-CM | POA: Diagnosis not present

## 2016-03-09 DIAGNOSIS — Z0000 Encounter for general adult medical examination without abnormal findings: Secondary | ICD-10-CM | POA: Diagnosis not present

## 2016-03-16 ENCOUNTER — Other Ambulatory Visit: Payer: Self-pay | Admitting: Family Medicine

## 2016-03-16 DIAGNOSIS — Z1231 Encounter for screening mammogram for malignant neoplasm of breast: Secondary | ICD-10-CM

## 2016-04-28 ENCOUNTER — Ambulatory Visit
Admission: RE | Admit: 2016-04-28 | Discharge: 2016-04-28 | Disposition: A | Discharge status: Home / Self Care | Payer: Commercial Managed Care - HMO | Source: Ambulatory Visit | Attending: Family Medicine | Admitting: Family Medicine

## 2016-04-28 DIAGNOSIS — Z1231 Encounter for screening mammogram for malignant neoplasm of breast: Secondary | ICD-10-CM

## 2016-06-17 ENCOUNTER — Ambulatory Visit: Payer: Commercial Managed Care - HMO | Admitting: Podiatry

## 2016-07-13 ENCOUNTER — Encounter: Payer: Self-pay | Admitting: Podiatry

## 2016-07-13 ENCOUNTER — Ambulatory Visit (INDEPENDENT_AMBULATORY_CARE_PROVIDER_SITE_OTHER): Payer: Medicare HMO | Admitting: Podiatry

## 2016-07-13 DIAGNOSIS — B351 Tinea unguium: Secondary | ICD-10-CM | POA: Diagnosis not present

## 2016-07-13 DIAGNOSIS — M79676 Pain in unspecified toe(s): Secondary | ICD-10-CM | POA: Diagnosis not present

## 2016-07-13 NOTE — Progress Notes (Signed)
Patient ID: Aimee Henderson, female   DOB: 12/11/1945, 71 y.o.   MRN: [id number] Complaint:  Visit Type: Patient returns to my office for continued preventative foot care services. Complaint: Patient states" my nails have grown long and thick and become painful to walk and wear shoes" Patient has been diagnosed with DM with no foot complications. The patient presents for preventative foot care services. No changes to ROS  Podiatric Exam: Vascular: dorsalis pedis and posterior tibial pulses are palpable bilateral. Capillary return is immediate. Temperature gradient is WNL. Skin turgor WNL  Sensorium: Normal Semmes Weinstein monofilament test. Normal tactile sensation bilaterally. Nail Exam: Pt has thick disfigured discolored nails with subungual debris noted bilateral entire nail hallux through fifth toenails Ulcer Exam: There is no evidence of ulcer or pre-ulcerative changes or infection. Orthopedic Exam: Muscle tone and strength are WNL. No limitations in general ROM. No crepitus or effusions noted. Foot type and digits show no abnormalities. Bony prominences are unremarkable. Plantar fibroma left foot. Skin: No Porokeratosis. No infection or ulcers  Diagnosis:  Onychomycosis, , Pain in right toe, pain in left toes  Treatment & Plan Procedures and Treatment: Consent by patient was obtained for treatment procedures. The patient understood the discussion of treatment and procedures well. All questions were answered thoroughly reviewed. Debridement of mycotic and hypertrophic toenails, 1 through 5 bilateral and clearing of subungual debris. No ulceration, no infection noted.  Return Visit-Office Procedure: Patient instructed to return to the office for a follow up visit 3 months for continued evaluation and treatment.    Embry Huss DPM 

## 2016-09-07 DIAGNOSIS — H2513 Age-related nuclear cataract, bilateral: Secondary | ICD-10-CM | POA: Diagnosis not present

## 2016-09-07 DIAGNOSIS — E119 Type 2 diabetes mellitus without complications: Secondary | ICD-10-CM | POA: Diagnosis not present

## 2016-09-14 DIAGNOSIS — Z7984 Long term (current) use of oral hypoglycemic drugs: Secondary | ICD-10-CM | POA: Diagnosis not present

## 2016-09-14 DIAGNOSIS — A084 Viral intestinal infection, unspecified: Secondary | ICD-10-CM | POA: Diagnosis not present

## 2016-09-14 DIAGNOSIS — I10 Essential (primary) hypertension: Secondary | ICD-10-CM | POA: Diagnosis not present

## 2016-09-14 DIAGNOSIS — E119 Type 2 diabetes mellitus without complications: Secondary | ICD-10-CM | POA: Diagnosis not present

## 2016-09-14 DIAGNOSIS — E785 Hyperlipidemia, unspecified: Secondary | ICD-10-CM | POA: Diagnosis not present

## 2016-11-22 ENCOUNTER — Ambulatory Visit: Payer: Medicare HMO | Admitting: Podiatry

## 2017-04-04 ENCOUNTER — Other Ambulatory Visit: Payer: Self-pay | Admitting: Family Medicine

## 2017-04-04 DIAGNOSIS — Z139 Encounter for screening, unspecified: Secondary | ICD-10-CM

## 2017-04-19 DIAGNOSIS — Z0000 Encounter for general adult medical examination without abnormal findings: Secondary | ICD-10-CM | POA: Diagnosis not present

## 2017-04-19 DIAGNOSIS — Z6833 Body mass index (BMI) 33.0-33.9, adult: Secondary | ICD-10-CM | POA: Diagnosis not present

## 2017-04-19 DIAGNOSIS — E785 Hyperlipidemia, unspecified: Secondary | ICD-10-CM | POA: Diagnosis not present

## 2017-04-19 DIAGNOSIS — I10 Essential (primary) hypertension: Secondary | ICD-10-CM | POA: Diagnosis not present

## 2017-04-19 DIAGNOSIS — E119 Type 2 diabetes mellitus without complications: Secondary | ICD-10-CM | POA: Diagnosis not present

## 2017-04-26 ENCOUNTER — Ambulatory Visit: Payer: Medicare HMO | Admitting: Podiatry

## 2017-04-26 ENCOUNTER — Encounter: Payer: Self-pay | Admitting: Podiatry

## 2017-04-26 DIAGNOSIS — M79676 Pain in unspecified toe(s): Secondary | ICD-10-CM | POA: Diagnosis not present

## 2017-04-26 DIAGNOSIS — B351 Tinea unguium: Secondary | ICD-10-CM

## 2017-04-26 DIAGNOSIS — E119 Type 2 diabetes mellitus without complications: Secondary | ICD-10-CM | POA: Diagnosis not present

## 2017-04-26 NOTE — Progress Notes (Signed)
Patient ID: Aimee Henderson, female   DOB: March 21, 1945, 72 y.o.   MRN: [id number] Complaint:  Visit Type: Patient returns to my office for continued preventative foot care services. Complaint: Patient states" my nails have grown long and thick and become painful to walk and wear shoes" Patient has been diagnosed with DM with no foot complications. The patient presents for preventative foot care services. No changes to ROS  Podiatric Exam: Vascular: dorsalis pedis and posterior tibial pulses are palpable bilateral. Capillary return is immediate. Temperature gradient is WNL. Skin turgor WNL  Sensorium: Normal Semmes Weinstein monofilament test. Normal tactile sensation bilaterally. Nail Exam: Pt has thick disfigured discolored nails with subungual debris noted bilateral entire nail hallux through fifth toenails Ulcer Exam: There is no evidence of ulcer or pre-ulcerative changes or infection. Orthopedic Exam: Muscle tone and strength are WNL. No limitations in general ROM. No crepitus or effusions noted. Foot type and digits show no abnormalities. Bony prominences are unremarkable. Plantar fibroma left foot. Skin: No Porokeratosis. No infection or ulcers  Diagnosis:  Onychomycosis, , Pain in right toe, pain in left toes  Treatment & Plan Procedures and Treatment: Consent by patient was obtained for treatment procedures. The patient understood the discussion of treatment and procedures well. All questions were answered thoroughly reviewed. Debridement of mycotic and hypertrophic toenails, 1 through 5 bilateral and clearing of subungual debris. No ulceration, no infection noted.  Return Visit-Office Procedure: Patient instructed to return to the office for a follow up visit 3 months for continued evaluation and treatment.    Gardiner Barefoot DPM

## 2017-05-02 ENCOUNTER — Ambulatory Visit
Admission: RE | Admit: 2017-05-02 | Discharge: 2017-05-02 | Disposition: A | Discharge status: Home / Self Care | Payer: Commercial Managed Care - HMO | Source: Ambulatory Visit | Attending: Family Medicine | Admitting: Family Medicine

## 2017-05-02 DIAGNOSIS — Z1231 Encounter for screening mammogram for malignant neoplasm of breast: Secondary | ICD-10-CM | POA: Diagnosis not present

## 2017-05-02 DIAGNOSIS — Z139 Encounter for screening, unspecified: Secondary | ICD-10-CM

## 2017-05-05 DIAGNOSIS — A084 Viral intestinal infection, unspecified: Secondary | ICD-10-CM | POA: Diagnosis not present

## 2017-08-08 DIAGNOSIS — R58 Hemorrhage, not elsewhere classified: Secondary | ICD-10-CM | POA: Diagnosis not present

## 2017-08-18 ENCOUNTER — Ambulatory Visit: Payer: Medicare HMO | Admitting: Podiatry

## 2017-09-11 DIAGNOSIS — H2513 Age-related nuclear cataract, bilateral: Secondary | ICD-10-CM | POA: Diagnosis not present

## 2017-09-11 DIAGNOSIS — E119 Type 2 diabetes mellitus without complications: Secondary | ICD-10-CM | POA: Diagnosis not present

## 2017-10-20 DIAGNOSIS — E1169 Type 2 diabetes mellitus with other specified complication: Secondary | ICD-10-CM | POA: Diagnosis not present

## 2017-10-20 DIAGNOSIS — E119 Type 2 diabetes mellitus without complications: Secondary | ICD-10-CM | POA: Diagnosis not present

## 2017-10-20 DIAGNOSIS — Z23 Encounter for immunization: Secondary | ICD-10-CM | POA: Diagnosis not present

## 2017-10-20 DIAGNOSIS — E785 Hyperlipidemia, unspecified: Secondary | ICD-10-CM | POA: Diagnosis not present

## 2017-10-20 DIAGNOSIS — I10 Essential (primary) hypertension: Secondary | ICD-10-CM | POA: Diagnosis not present

## 2017-10-24 ENCOUNTER — Ambulatory Visit: Payer: Medicare HMO | Admitting: Podiatry

## 2017-10-24 ENCOUNTER — Encounter: Payer: Self-pay | Admitting: Podiatry

## 2017-10-24 DIAGNOSIS — B351 Tinea unguium: Secondary | ICD-10-CM | POA: Diagnosis not present

## 2017-10-24 DIAGNOSIS — E119 Type 2 diabetes mellitus without complications: Secondary | ICD-10-CM

## 2017-10-24 DIAGNOSIS — M79676 Pain in unspecified toe(s): Secondary | ICD-10-CM

## 2017-10-24 NOTE — Progress Notes (Signed)
Patient ID: Aimee Henderson, female   DOB: 10/08/1945, 72 y.o.   MRN: [id number] Complaint:  Visit Type: Patient returns to my office for continued preventative foot care services. Complaint: Patient states" my nails have grown long and thick and become painful to walk and wear shoes" Patient has been diagnosed with DM with no foot complications. The patient presents for preventative foot care services. No changes to ROS  Podiatric Exam: Vascular: dorsalis pedis and posterior tibial pulses are palpable bilateral. Capillary return is immediate. Temperature gradient is WNL. Skin turgor WNL  Sensorium: Normal Semmes Weinstein monofilament test. Normal tactile sensation bilaterally. Nail Exam: Pt has thick disfigured discolored nails with subungual debris noted bilateral entire nail hallux through fifth toenails Ulcer Exam: There is no evidence of ulcer or pre-ulcerative changes or infection. Orthopedic Exam: Muscle tone and strength are WNL. No limitations in general ROM. No crepitus or effusions noted. Foot type and digits show no abnormalities. Bony prominences are unremarkable. Plantar fibroma left foot. Skin: No Porokeratosis. No infection or ulcers  Diagnosis:  Onychomycosis, , Pain in right toe, pain in left toes  Treatment & Plan Procedures and Treatment: Consent by patient was obtained for treatment procedures. The patient understood the discussion of treatment and procedures well. All questions were answered thoroughly reviewed. Debridement of mycotic and hypertrophic toenails, 1 through 5 bilateral and clearing of subungual debris. No ulceration, no infection noted.  Return Visit-Office Procedure: Patient instructed to return to the office for a follow up visit 4 months for continued evaluation and treatment.    Hartlee Amedee DPM 

## 2018-02-08 DIAGNOSIS — Z7984 Long term (current) use of oral hypoglycemic drugs: Secondary | ICD-10-CM | POA: Diagnosis not present

## 2018-02-08 DIAGNOSIS — E119 Type 2 diabetes mellitus without complications: Secondary | ICD-10-CM | POA: Diagnosis not present

## 2018-02-08 DIAGNOSIS — H2513 Age-related nuclear cataract, bilateral: Secondary | ICD-10-CM | POA: Diagnosis not present

## 2018-02-08 DIAGNOSIS — H04201 Unspecified epiphora, right lacrimal gland: Secondary | ICD-10-CM | POA: Diagnosis not present

## 2018-02-21 ENCOUNTER — Encounter: Payer: Self-pay | Admitting: Podiatry

## 2018-02-21 ENCOUNTER — Encounter: Payer: Self-pay | Admitting: *Deleted

## 2018-02-21 ENCOUNTER — Ambulatory Visit: Payer: Medicare HMO | Admitting: Podiatry

## 2018-02-21 DIAGNOSIS — B351 Tinea unguium: Secondary | ICD-10-CM

## 2018-02-21 DIAGNOSIS — E119 Type 2 diabetes mellitus without complications: Secondary | ICD-10-CM

## 2018-02-21 DIAGNOSIS — M79676 Pain in unspecified toe(s): Secondary | ICD-10-CM

## 2018-02-21 NOTE — Progress Notes (Signed)
Patient ID: Aimee Henderson, female   DOB: [birth year], 73 y.o.   MRN: [id number] Complaint:  Visit Type: Patient returns to my office for continued preventative foot care services. Complaint: Patient states" my nails have grown long and thick and become painful to walk and wear shoes" Patient has been diagnosed with DM with no foot complications. The patient presents for preventative foot care services. No changes to ROS  Podiatric Exam: Vascular: dorsalis pedis and posterior tibial pulses are palpable bilateral. Capillary return is immediate. Temperature gradient is WNL. Skin turgor WNL  Sensorium: Normal Semmes Weinstein monofilament test. Normal tactile sensation bilaterally. Nail Exam: Pt has thick disfigured discolored nails with subungual debris noted bilateral entire nail hallux through fifth toenails Ulcer Exam: There is no evidence of ulcer or pre-ulcerative changes or infection. Orthopedic Exam: Muscle tone and strength are WNL. No limitations in general ROM. No crepitus or effusions noted. Foot type and digits show no abnormalities. Bony prominences are unremarkable. Plantar fibroma left foot. Skin: No Porokeratosis. No infection or ulcers  Diagnosis:  Onychomycosis, , Pain in right toe, pain in left toes  Treatment & Plan Procedures and Treatment: Consent by patient was obtained for treatment procedures. The patient understood the discussion of treatment and procedures well. All questions were answered thoroughly reviewed. Debridement of mycotic and hypertrophic toenails, 1 through 5 bilateral and clearing of subungual debris. No ulceration, no infection noted.  Return Visit-Office Procedure: Patient instructed to return to the office for a follow up visit 4 months for continued evaluation and treatment.    Gardiner Barefoot DPM

## 2018-06-19 ENCOUNTER — Ambulatory Visit: Payer: Medicare HMO | Admitting: Podiatry

## 2018-09-07 ENCOUNTER — Other Ambulatory Visit: Payer: Self-pay | Admitting: Family Medicine

## 2018-09-07 DIAGNOSIS — Z1231 Encounter for screening mammogram for malignant neoplasm of breast: Secondary | ICD-10-CM

## 2018-09-11 ENCOUNTER — Encounter: Payer: Self-pay | Admitting: Podiatry

## 2018-09-11 ENCOUNTER — Ambulatory Visit: Payer: Medicare HMO | Admitting: Podiatry

## 2018-09-11 ENCOUNTER — Other Ambulatory Visit: Payer: Self-pay

## 2018-09-11 VITALS — Temp 97.8°F

## 2018-09-11 DIAGNOSIS — E119 Type 2 diabetes mellitus without complications: Secondary | ICD-10-CM | POA: Diagnosis not present

## 2018-09-11 DIAGNOSIS — M79676 Pain in unspecified toe(s): Secondary | ICD-10-CM | POA: Diagnosis not present

## 2018-09-11 DIAGNOSIS — B351 Tinea unguium: Secondary | ICD-10-CM | POA: Diagnosis not present

## 2018-09-11 NOTE — Progress Notes (Signed)
Patient ID: Aimee Henderson, female   DOB: [birth year], 73 y.o.   MRN: [id number] Complaint:  Visit Type: Patient returns to my office for continued preventative foot care services. Complaint: Patient states" my nails have grown long and thick and become painful to walk and wear shoes" Patient has been diagnosed with DM with no foot complications. The patient presents for preventative foot care services. No changes to ROS  Podiatric Exam: Vascular: dorsalis pedis and posterior tibial pulses are palpable bilateral. Capillary return is immediate. Temperature gradient is WNL. Skin turgor WNL  Sensorium: Normal Semmes Weinstein monofilament test. Normal tactile sensation bilaterally. Nail Exam: Pt has thick disfigured discolored nails with subungual debris noted bilateral entire nail hallux through fifth toenails Ulcer Exam: There is no evidence of ulcer or pre-ulcerative changes or infection. Orthopedic Exam: Muscle tone and strength are WNL. No limitations in general ROM. No crepitus or effusions noted. Foot type and digits show no abnormalities. Bony prominences are unremarkable. Plantar fibroma left foot. Skin: No Porokeratosis. No infection or ulcers  Diagnosis:  Onychomycosis, , Pain in right toe, pain in left toes  Treatment & Plan Procedures and Treatment: Consent by patient was obtained for treatment procedures. The patient understood the discussion of treatment and procedures well. All questions were answered thoroughly reviewed. Debridement of mycotic and hypertrophic toenails, 1 through 5 bilateral and clearing of subungual debris. No ulceration, no infection noted.  Return Visit-Office Procedure: Patient instructed to return to the office for a follow up visit 4 months for continued evaluation and treatment.    Gardiner Barefoot DPM

## 2018-09-12 DIAGNOSIS — H2513 Age-related nuclear cataract, bilateral: Secondary | ICD-10-CM | POA: Diagnosis not present

## 2018-09-12 DIAGNOSIS — E119 Type 2 diabetes mellitus without complications: Secondary | ICD-10-CM | POA: Diagnosis not present

## 2018-10-23 ENCOUNTER — Other Ambulatory Visit: Payer: Self-pay

## 2018-10-23 ENCOUNTER — Ambulatory Visit
Admission: RE | Admit: 2018-10-23 | Discharge: 2018-10-23 | Disposition: A | Discharge status: Home / Self Care | Payer: Medicare HMO | Source: Ambulatory Visit | Attending: Family Medicine | Admitting: Family Medicine

## 2018-10-23 DIAGNOSIS — Z1231 Encounter for screening mammogram for malignant neoplasm of breast: Secondary | ICD-10-CM | POA: Diagnosis not present

## 2019-01-09 ENCOUNTER — Other Ambulatory Visit: Payer: Self-pay

## 2019-01-09 ENCOUNTER — Ambulatory Visit: Payer: Medicare HMO | Admitting: Podiatry

## 2019-01-09 ENCOUNTER — Encounter: Payer: Self-pay | Admitting: Podiatry

## 2019-01-09 DIAGNOSIS — M79676 Pain in unspecified toe(s): Secondary | ICD-10-CM

## 2019-01-09 DIAGNOSIS — E119 Type 2 diabetes mellitus without complications: Secondary | ICD-10-CM

## 2019-01-09 DIAGNOSIS — B351 Tinea unguium: Secondary | ICD-10-CM

## 2019-01-09 NOTE — Progress Notes (Signed)
Patient ID: Aimee Henderson, female   DOB: [birth year], 73 y.o.   MRN: [id number] Complaint:  Visit Type: Patient returns to my office for continued preventative foot care services. Complaint: Patient states" my nails have grown long and thick and become painful to walk and wear shoes" Patient has been diagnosed with DM with no foot complications. The patient presents for preventative foot care services. No changes to ROS.  Patient does want her nails trimmed closely.  Podiatric Exam: Vascular: dorsalis pedis and posterior tibial pulses are palpable bilateral. Capillary return is immediate. Temperature gradient is WNL. Skin turgor WNL  Sensorium: Normal Semmes Weinstein monofilament test. Normal tactile sensation bilaterally. Nail Exam: Pt has thick disfigured discolored nails with subungual debris noted bilateral entire nail hallux through fifth toenails Ulcer Exam: There is no evidence of ulcer or pre-ulcerative changes or infection. Orthopedic Exam: Muscle tone and strength are WNL. No limitations in general ROM. No crepitus or effusions noted. Foot type and digits show no abnormalities. Bony prominences are unremarkable. Plantar fibroma left foot. Skin: No Porokeratosis. No infection or ulcers  Diagnosis:  Onychomycosis, , Pain in right toe, pain in left toes  Treatment & Plan Procedures and Treatment: Consent by patient was obtained for treatment procedures. The patient understood the discussion of treatment and procedures well. All questions were answered thoroughly reviewed. Debridement of mycotic and hypertrophic toenails, 1 through 5 bilateral and clearing of subungual debris. No ulceration, no infection noted.  Return Visit-Office Procedure: Patient instructed to return to the office for a follow up visit 4 months for continued evaluation and treatment.    Gardiner Barefoot DPM

## 2019-03-04 DIAGNOSIS — E785 Hyperlipidemia, unspecified: Secondary | ICD-10-CM | POA: Diagnosis not present

## 2019-03-04 DIAGNOSIS — Z0000 Encounter for general adult medical examination without abnormal findings: Secondary | ICD-10-CM | POA: Diagnosis not present

## 2019-03-04 DIAGNOSIS — I10 Essential (primary) hypertension: Secondary | ICD-10-CM | POA: Diagnosis not present

## 2019-03-04 DIAGNOSIS — E1169 Type 2 diabetes mellitus with other specified complication: Secondary | ICD-10-CM | POA: Diagnosis not present

## 2019-03-15 DIAGNOSIS — E1169 Type 2 diabetes mellitus with other specified complication: Secondary | ICD-10-CM | POA: Diagnosis not present

## 2019-03-15 DIAGNOSIS — E785 Hyperlipidemia, unspecified: Secondary | ICD-10-CM | POA: Diagnosis not present

## 2019-03-15 DIAGNOSIS — Z7984 Long term (current) use of oral hypoglycemic drugs: Secondary | ICD-10-CM | POA: Diagnosis not present

## 2019-05-15 ENCOUNTER — Ambulatory Visit: Payer: Medicare HMO | Admitting: Podiatry

## 2019-06-07 DIAGNOSIS — E1165 Type 2 diabetes mellitus with hyperglycemia: Secondary | ICD-10-CM | POA: Diagnosis not present

## 2019-06-07 DIAGNOSIS — Z79899 Other long term (current) drug therapy: Secondary | ICD-10-CM | POA: Diagnosis not present

## 2019-06-07 DIAGNOSIS — I10 Essential (primary) hypertension: Secondary | ICD-10-CM | POA: Diagnosis not present

## 2019-06-07 DIAGNOSIS — E785 Hyperlipidemia, unspecified: Secondary | ICD-10-CM | POA: Diagnosis not present

## 2019-06-07 DIAGNOSIS — J301 Allergic rhinitis due to pollen: Secondary | ICD-10-CM | POA: Diagnosis not present

## 2019-06-07 DIAGNOSIS — E1169 Type 2 diabetes mellitus with other specified complication: Secondary | ICD-10-CM | POA: Diagnosis not present

## 2019-09-18 DIAGNOSIS — E669 Obesity, unspecified: Secondary | ICD-10-CM | POA: Diagnosis not present

## 2019-09-18 DIAGNOSIS — I10 Essential (primary) hypertension: Secondary | ICD-10-CM | POA: Diagnosis not present

## 2019-09-18 DIAGNOSIS — E1169 Type 2 diabetes mellitus with other specified complication: Secondary | ICD-10-CM | POA: Diagnosis not present

## 2019-09-18 DIAGNOSIS — E785 Hyperlipidemia, unspecified: Secondary | ICD-10-CM | POA: Diagnosis not present

## 2019-10-04 ENCOUNTER — Other Ambulatory Visit: Payer: Self-pay | Admitting: Family Medicine

## 2019-10-04 DIAGNOSIS — Z1231 Encounter for screening mammogram for malignant neoplasm of breast: Secondary | ICD-10-CM

## 2019-10-07 DIAGNOSIS — H52203 Unspecified astigmatism, bilateral: Secondary | ICD-10-CM | POA: Diagnosis not present

## 2019-10-07 DIAGNOSIS — H5213 Myopia, bilateral: Secondary | ICD-10-CM | POA: Diagnosis not present

## 2019-10-07 DIAGNOSIS — H04211 Epiphora due to excess lacrimation, right lacrimal gland: Secondary | ICD-10-CM | POA: Diagnosis not present

## 2019-10-07 DIAGNOSIS — H524 Presbyopia: Secondary | ICD-10-CM | POA: Diagnosis not present

## 2019-10-07 DIAGNOSIS — E1136 Type 2 diabetes mellitus with diabetic cataract: Secondary | ICD-10-CM | POA: Diagnosis not present

## 2019-10-07 DIAGNOSIS — H2513 Age-related nuclear cataract, bilateral: Secondary | ICD-10-CM | POA: Diagnosis not present

## 2019-10-07 DIAGNOSIS — Z7984 Long term (current) use of oral hypoglycemic drugs: Secondary | ICD-10-CM | POA: Diagnosis not present

## 2019-10-25 ENCOUNTER — Other Ambulatory Visit: Payer: Self-pay

## 2019-10-25 ENCOUNTER — Ambulatory Visit
Admission: RE | Admit: 2019-10-25 | Discharge: 2019-10-25 | Disposition: A | Discharge status: Home / Self Care | Payer: Medicare HMO | Source: Ambulatory Visit | Attending: Family Medicine | Admitting: Family Medicine

## 2019-10-25 DIAGNOSIS — Z1231 Encounter for screening mammogram for malignant neoplasm of breast: Secondary | ICD-10-CM

## 2019-10-29 ENCOUNTER — Encounter: Payer: Self-pay | Admitting: Podiatry

## 2019-10-29 ENCOUNTER — Other Ambulatory Visit: Payer: Self-pay

## 2019-10-29 ENCOUNTER — Ambulatory Visit (INDEPENDENT_AMBULATORY_CARE_PROVIDER_SITE_OTHER): Payer: Medicare HMO | Admitting: Podiatry

## 2019-10-29 DIAGNOSIS — E119 Type 2 diabetes mellitus without complications: Secondary | ICD-10-CM | POA: Diagnosis not present

## 2019-10-29 DIAGNOSIS — M79676 Pain in unspecified toe(s): Secondary | ICD-10-CM

## 2019-10-29 DIAGNOSIS — B351 Tinea unguium: Secondary | ICD-10-CM

## 2019-10-29 NOTE — Progress Notes (Signed)
This patient returns to my office for at risk foot care.  This patient requires this care by a professional since this patient will be at risk due to having diabetes.   This patient is unable to cut nails herself since the patient cannot reach her nails.These nails are painful walking and wearing shoes.  This patient presents for at risk foot care today.  General Appearance  Alert, conversant and in no acute stress.  Vascular  Dorsalis pedis and posterior tibial  pulses are palpable  bilaterally.  Capillary return is within normal limits  bilaterally. Temperature is within normal limits  bilaterally.  Neurologic  Senn-Weinstein monofilament wire test within normal limits  bilaterally. Muscle power within normal limits bilaterally.  Nails Thick disfigured discolored nails with subungual debris  from hallux to fifth toes bilaterally. No evidence of bacterial infection or drainage bilaterally.  Orthopedic  No limitations of motion  feet .  No crepitus or effusions noted.  No bony pathology or digital deformities noted.Plantar fibroma left foot. Skin  normotropic skin with no porokeratosis noted bilaterally.  No signs of infections or ulcers noted.     Onychomycosis  Pain in right toes  Pain in left toes  Consent was obtained for treatment procedures.   Mechanical debridement of nails 1-5  bilaterally performed with a nail nipper.  Filed with dremel without incident.    Return office visit 3 months                     Told patient to return for periodic foot care and evaluation due to potential at risk complications.   Gardiner Barefoot DPM

## 2019-11-07 DIAGNOSIS — E1169 Type 2 diabetes mellitus with other specified complication: Secondary | ICD-10-CM | POA: Diagnosis not present

## 2019-11-07 DIAGNOSIS — E785 Hyperlipidemia, unspecified: Secondary | ICD-10-CM | POA: Diagnosis not present

## 2019-11-07 DIAGNOSIS — I10 Essential (primary) hypertension: Secondary | ICD-10-CM | POA: Diagnosis not present

## 2019-12-11 DIAGNOSIS — E785 Hyperlipidemia, unspecified: Secondary | ICD-10-CM | POA: Diagnosis not present

## 2019-12-11 DIAGNOSIS — E1169 Type 2 diabetes mellitus with other specified complication: Secondary | ICD-10-CM | POA: Diagnosis not present

## 2019-12-11 DIAGNOSIS — E669 Obesity, unspecified: Secondary | ICD-10-CM | POA: Diagnosis not present

## 2019-12-11 DIAGNOSIS — I10 Essential (primary) hypertension: Secondary | ICD-10-CM | POA: Diagnosis not present

## 2019-12-11 DIAGNOSIS — Z23 Encounter for immunization: Secondary | ICD-10-CM | POA: Diagnosis not present

## 2019-12-12 DIAGNOSIS — I10 Essential (primary) hypertension: Secondary | ICD-10-CM | POA: Diagnosis not present

## 2019-12-12 DIAGNOSIS — E1169 Type 2 diabetes mellitus with other specified complication: Secondary | ICD-10-CM | POA: Diagnosis not present

## 2019-12-12 DIAGNOSIS — E785 Hyperlipidemia, unspecified: Secondary | ICD-10-CM | POA: Diagnosis not present

## 2020-01-28 ENCOUNTER — Other Ambulatory Visit: Payer: Self-pay | Admitting: Podiatry

## 2020-01-28 ENCOUNTER — Ambulatory Visit: Payer: Medicare HMO | Admitting: Podiatry

## 2020-01-28 ENCOUNTER — Other Ambulatory Visit: Payer: Self-pay

## 2020-01-28 ENCOUNTER — Ambulatory Visit (INDEPENDENT_AMBULATORY_CARE_PROVIDER_SITE_OTHER): Payer: Medicare HMO

## 2020-01-28 DIAGNOSIS — E669 Obesity, unspecified: Secondary | ICD-10-CM | POA: Insufficient documentation

## 2020-01-28 DIAGNOSIS — M722 Plantar fascial fibromatosis: Secondary | ICD-10-CM

## 2020-01-28 DIAGNOSIS — J301 Allergic rhinitis due to pollen: Secondary | ICD-10-CM | POA: Insufficient documentation

## 2020-01-28 DIAGNOSIS — J309 Allergic rhinitis, unspecified: Secondary | ICD-10-CM | POA: Insufficient documentation

## 2020-01-28 DIAGNOSIS — M79672 Pain in left foot: Secondary | ICD-10-CM

## 2020-01-28 DIAGNOSIS — M79671 Pain in right foot: Secondary | ICD-10-CM

## 2020-01-28 DIAGNOSIS — E785 Hyperlipidemia, unspecified: Secondary | ICD-10-CM | POA: Insufficient documentation

## 2020-01-28 DIAGNOSIS — I10 Essential (primary) hypertension: Secondary | ICD-10-CM | POA: Insufficient documentation

## 2020-01-28 NOTE — Patient Instructions (Signed)

## 2020-01-28 NOTE — Progress Notes (Signed)
  Subjective:  Patient ID: Aimee Henderson, female    DOB: December 17, 1945,  MRN: [id number]  Chief Complaint  Patient presents with  . Pain    Rt bottom heel pain x 1 wk; 8/10 constant sharp pains - no injury/swelling Tx: salt and alcohol soakings and elevation   . Diabetes    FBS: 127 a1C: 7.5 PCP: White x 3 wks    74 y.o. female presents with the above complaint. History confirmed with patient.  Normally sees Dr. Prudence Davidson for routine care.  She returns to see me today for a new issue with plantar heel pain on the right side.  Objective:  Physical Exam: warm, good capillary refill, no trophic changes or ulcerative lesions, normal DP and PT pulses    Right Foot: Gastrocnemius equinus is present with a positive Silfverskiold test.  There is pain palpation of the plantar medial heel at the insertion of the plantar fascia the calcaneus no pain in the mid fascia  Radiographs: X-ray of the right foot: Small calcaneal enthesophyte noted posteriorly, none plantar Assessment:   1. Plantar fasciitis of right foot      Plan:  Patient was evaluated and treated and all questions answered.  Discussed the etiology and treatment options for plantar fasciitis including stretching, formal physical therapy, supportive shoegears such as a running shoe or sneaker, pre fabricated orthoses, injection therapy, and oral medications. We also discussed the role of surgical treatment of this for patients who do not improve after exhausting non-surgical treatment options.   -Encouraged icing and stretching of the limb twice daily, exercises provided -Prefer to avoid standing dose of meloxicam due to her diabetes.  Recommended Motrin over-the-counter as needed -Plantar fascial brace was dispensed -Night splint was dispensed. -She would prefer to avoid injections and I discussed with her that this is probably a good idea considering it could raise her blood sugar and her A1c is borderline for being out of  control.   Return in about 1 month (around 02/28/2020) for recheck plantar fasciitis.

## 2020-02-25 ENCOUNTER — Ambulatory Visit: Payer: Medicare HMO | Admitting: Podiatry

## 2020-03-24 ENCOUNTER — Other Ambulatory Visit: Payer: Self-pay | Admitting: Family Medicine

## 2020-03-24 DIAGNOSIS — I10 Essential (primary) hypertension: Secondary | ICD-10-CM | POA: Diagnosis not present

## 2020-03-24 DIAGNOSIS — Z0000 Encounter for general adult medical examination without abnormal findings: Secondary | ICD-10-CM | POA: Diagnosis not present

## 2020-03-24 DIAGNOSIS — Z1159 Encounter for screening for other viral diseases: Secondary | ICD-10-CM | POA: Diagnosis not present

## 2020-03-24 DIAGNOSIS — J309 Allergic rhinitis, unspecified: Secondary | ICD-10-CM | POA: Diagnosis not present

## 2020-03-24 DIAGNOSIS — E785 Hyperlipidemia, unspecified: Secondary | ICD-10-CM | POA: Diagnosis not present

## 2020-03-24 DIAGNOSIS — E669 Obesity, unspecified: Secondary | ICD-10-CM | POA: Diagnosis not present

## 2020-03-24 DIAGNOSIS — Z23 Encounter for immunization: Secondary | ICD-10-CM | POA: Diagnosis not present

## 2020-03-24 DIAGNOSIS — E2839 Other primary ovarian failure: Secondary | ICD-10-CM

## 2020-03-24 DIAGNOSIS — E1169 Type 2 diabetes mellitus with other specified complication: Secondary | ICD-10-CM | POA: Diagnosis not present

## 2020-04-07 ENCOUNTER — Other Ambulatory Visit: Payer: Self-pay

## 2020-04-07 ENCOUNTER — Ambulatory Visit: Payer: Medicare HMO | Admitting: Podiatry

## 2020-04-14 ENCOUNTER — Ambulatory Visit: Payer: Medicare HMO | Admitting: Podiatry

## 2020-04-14 ENCOUNTER — Other Ambulatory Visit: Payer: Self-pay

## 2020-04-14 DIAGNOSIS — E119 Type 2 diabetes mellitus without complications: Secondary | ICD-10-CM | POA: Diagnosis not present

## 2020-04-14 DIAGNOSIS — M722 Plantar fascial fibromatosis: Secondary | ICD-10-CM

## 2020-04-14 NOTE — Progress Notes (Signed)
  Subjective:  Patient ID: Aimee Henderson, female    DOB: Apr 30, 1945,  MRN: [id number]  Chief Complaint  Patient presents with  . Plantar Fasciitis    Pt stated that she is doing well she does still have some pain    75 y.o. female returns with the above complaint. History confirmed with patient.  Having some improvement, she has had a lot going on with her family, her son suffered a stroke on Christmas Eve.  Objective:  Physical Exam: warm, good capillary refill, no trophic changes or ulcerative lesions, normal DP and PT pulses    Right Foot: Gastrocnemius equinus is present with a positive Silfverskiold test.  There is pain palpation of the plantar medial heel at the insertion of the plantar fascia the calcaneus no pain in the mid fascia  Radiographs: X-ray of the right foot: Small calcaneal enthesophyte noted posteriorly, none plantar Assessment:   1. Plantar fasciitis of right foot   2. Diabetes mellitus without complication ([place])      Plan:  Patient was evaluated and treated and all questions answered.  Discussed the etiology and treatment options for plantar fasciitis including stretching, formal physical therapy, supportive shoegears such as a running shoe or sneaker, pre fabricated orthoses, injection therapy, and oral medications. We also discussed the role of surgical treatment of this for patients who do not improve after exhausting non-surgical treatment options.   -Continue stretching and icing. We discussed physical therapy which I think would be most beneficial, but given she has so much going on this is not possible currently -The night splint was not comfortable and has not helped -She would prefer to avoid injections, since this has been persistent we should consider one at next visit unless she has significant improvement   Return in about 6 weeks (around 05/26/2020) for recheck plantar fasciitis.

## 2020-05-12 DIAGNOSIS — E785 Hyperlipidemia, unspecified: Secondary | ICD-10-CM | POA: Diagnosis not present

## 2020-05-12 DIAGNOSIS — I10 Essential (primary) hypertension: Secondary | ICD-10-CM | POA: Diagnosis not present

## 2020-05-12 DIAGNOSIS — E1169 Type 2 diabetes mellitus with other specified complication: Secondary | ICD-10-CM | POA: Diagnosis not present

## 2020-05-26 ENCOUNTER — Ambulatory Visit: Payer: Medicare HMO | Admitting: Podiatry

## 2020-05-26 ENCOUNTER — Other Ambulatory Visit: Payer: Self-pay

## 2020-05-26 DIAGNOSIS — M722 Plantar fascial fibromatosis: Secondary | ICD-10-CM

## 2020-05-28 ENCOUNTER — Encounter: Payer: Self-pay | Admitting: Podiatry

## 2020-05-28 NOTE — Progress Notes (Signed)
  Subjective:  Patient ID: Aimee Henderson, female    DOB: 06-20-1945,  MRN: [id number]  Right heel pain, still has some it was not as bad as it was  75 y.o. female returns with the above complaint. History confirmed with patient.  Having some improvement,   Objective:  Physical Exam: warm, good capillary refill, no trophic changes or ulcerative lesions, normal DP and PT pulses    Right Foot: Gastrocnemius equinus is present with a positive Silfverskiold test.  There is pain palpation of the plantar medial heel at the insertion of the plantar fascia the calcaneus no pain in the mid fascia  Radiographs: X-ray of the right foot: Small calcaneal enthesophyte noted posteriorly, none plantar Assessment:   1. Plantar fasciitis of right foot      Plan:  Patient was evaluated and treated and all questions answered.  Discussed the etiology and treatment options for plantar fasciitis including stretching, formal physical therapy, supportive shoegears such as a running shoe or sneaker, pre fabricated orthoses, injection therapy, and oral medications. We also discussed the role of surgical treatment of this for patients who do not improve after exhausting non-surgical treatment options.   -Continue stretching and icing. We discussed physical therapy we will plan to do this at next visit if not better, she will continue her home stretching currently -Recommended injection today since she has not improved and she agreed  After sterile prep with povidone-iodine solution and alcohol, the  was injected with 0.5cc 2% xylocaine plain, 0.5cc 0.5% marcaine plain, 5mg  triamcinolone acetonide, and 2mg  dexamethasone was injected along  the plantar fascia at the insertion on the plantar calcaneus. The patient tolerated the procedure well without complication.   Return in about 1 month (around 06/25/2020) for recheck plantar fasciitis.

## 2020-06-12 ENCOUNTER — Ambulatory Visit: Payer: Medicare HMO | Admitting: Podiatry

## 2020-06-12 ENCOUNTER — Encounter: Payer: Self-pay | Admitting: Podiatry

## 2020-06-12 ENCOUNTER — Other Ambulatory Visit: Payer: Self-pay

## 2020-06-12 DIAGNOSIS — E119 Type 2 diabetes mellitus without complications: Secondary | ICD-10-CM

## 2020-06-12 DIAGNOSIS — M79676 Pain in unspecified toe(s): Secondary | ICD-10-CM | POA: Diagnosis not present

## 2020-06-12 DIAGNOSIS — B351 Tinea unguium: Secondary | ICD-10-CM

## 2020-06-12 NOTE — Progress Notes (Signed)

## 2020-06-25 ENCOUNTER — Ambulatory Visit: Payer: Medicare HMO | Admitting: Podiatry

## 2020-06-25 ENCOUNTER — Encounter: Payer: Self-pay | Admitting: Podiatry

## 2020-06-25 ENCOUNTER — Other Ambulatory Visit: Payer: Self-pay

## 2020-06-25 DIAGNOSIS — M722 Plantar fascial fibromatosis: Secondary | ICD-10-CM | POA: Diagnosis not present

## 2020-06-25 NOTE — Progress Notes (Signed)
  Subjective:  Patient ID: Aimee Henderson, female    DOB: October 03, 1945,  MRN: [id number]  Injection helped, she has no pain now  75 y.o. female returns with the above complaint. History confirmed with patient.  Doing much better  Objective:  Physical Exam: warm, good capillary refill, no trophic changes or ulcerative lesions, normal DP and PT pulses    Right Foot: Today she has no pain on palpation  Radiographs: X-ray of the right foot: Small calcaneal enthesophyte noted posteriorly, none plantar Assessment:   1. Plantar fasciitis of right foot      Plan:  Patient was evaluated and treated and all questions answered.  Overall doing much better after last injection.  She is pain-free.  Advised to continue to do her stretching exercises it begins to return and if it becomes severe return for another injection.  Return as needed   No follow-ups on file.

## 2020-09-21 ENCOUNTER — Encounter: Payer: Self-pay | Admitting: Gastroenterology

## 2020-09-21 DIAGNOSIS — Z7984 Long term (current) use of oral hypoglycemic drugs: Secondary | ICD-10-CM | POA: Diagnosis not present

## 2020-09-21 DIAGNOSIS — E1169 Type 2 diabetes mellitus with other specified complication: Secondary | ICD-10-CM | POA: Diagnosis not present

## 2020-09-21 DIAGNOSIS — E785 Hyperlipidemia, unspecified: Secondary | ICD-10-CM | POA: Diagnosis not present

## 2020-09-21 DIAGNOSIS — I10 Essential (primary) hypertension: Secondary | ICD-10-CM | POA: Diagnosis not present

## 2020-09-22 ENCOUNTER — Ambulatory Visit
Admission: RE | Admit: 2020-09-22 | Discharge: 2020-09-22 | Disposition: A | Discharge status: Home / Self Care | Payer: Medicare HMO | Source: Ambulatory Visit | Attending: Family Medicine | Admitting: Family Medicine

## 2020-09-22 ENCOUNTER — Other Ambulatory Visit: Payer: Self-pay

## 2020-09-22 DIAGNOSIS — Z780 Asymptomatic menopausal state: Secondary | ICD-10-CM | POA: Diagnosis not present

## 2020-09-22 DIAGNOSIS — E2839 Other primary ovarian failure: Secondary | ICD-10-CM

## 2020-10-07 ENCOUNTER — Other Ambulatory Visit: Payer: Self-pay | Admitting: Family Medicine

## 2020-10-07 DIAGNOSIS — Z1231 Encounter for screening mammogram for malignant neoplasm of breast: Secondary | ICD-10-CM

## 2020-10-09 DIAGNOSIS — E119 Type 2 diabetes mellitus without complications: Secondary | ICD-10-CM | POA: Diagnosis not present

## 2020-10-09 DIAGNOSIS — H524 Presbyopia: Secondary | ICD-10-CM | POA: Diagnosis not present

## 2020-10-09 DIAGNOSIS — H5213 Myopia, bilateral: Secondary | ICD-10-CM | POA: Diagnosis not present

## 2020-10-09 DIAGNOSIS — H2513 Age-related nuclear cataract, bilateral: Secondary | ICD-10-CM | POA: Diagnosis not present

## 2020-10-09 DIAGNOSIS — H52203 Unspecified astigmatism, bilateral: Secondary | ICD-10-CM | POA: Diagnosis not present

## 2020-10-09 DIAGNOSIS — H04211 Epiphora due to excess lacrimation, right lacrimal gland: Secondary | ICD-10-CM | POA: Diagnosis not present

## 2020-10-14 ENCOUNTER — Other Ambulatory Visit: Payer: Self-pay

## 2020-10-14 ENCOUNTER — Encounter: Payer: Self-pay | Admitting: Podiatry

## 2020-10-14 ENCOUNTER — Ambulatory Visit (INDEPENDENT_AMBULATORY_CARE_PROVIDER_SITE_OTHER): Payer: Medicare HMO | Admitting: Podiatry

## 2020-10-14 DIAGNOSIS — E119 Type 2 diabetes mellitus without complications: Secondary | ICD-10-CM | POA: Diagnosis not present

## 2020-10-14 DIAGNOSIS — M79676 Pain in unspecified toe(s): Secondary | ICD-10-CM | POA: Diagnosis not present

## 2020-10-14 DIAGNOSIS — B351 Tinea unguium: Secondary | ICD-10-CM | POA: Diagnosis not present

## 2020-10-14 NOTE — Progress Notes (Signed)
This patient returns to my office for at risk foot care.  This patient requires this care by a professional since this patient will be at risk due to having diabetes.   This patient is unable to cut nails herself since the patient cannot reach her nails.These nails are painful walking and wearing shoes.  This patient presents for at risk foot care today.  General Appearance  Alert, conversant and in no acute stress.  Vascular  Dorsalis pedis and posterior tibial  pulses are palpable  bilaterally.  Capillary return is within normal limits  bilaterally. Temperature is within normal limits  bilaterally.  Neurologic  Senn-Weinstein monofilament wire test within normal limits  bilaterally. Muscle power within normal limits bilaterally.  Nails Thick disfigured discolored nails with subungual debris  from hallux to fifth toes bilaterally. No evidence of bacterial infection or drainage bilaterally.  Orthopedic  No limitations of motion  feet .  No crepitus or effusions noted.  No bony pathology or digital deformities noted.Plantar fibroma left foot.  Skin  normotropic skin with no porokeratosis noted bilaterally.  No signs of infections or ulcers noted.     Onychomycosis  Pain in right toes  Pain in left toes  Consent was obtained for treatment procedures.   Mechanical debridement of nails 1-5  bilaterally performed with a nail nipper.  Filed with dremel without incident.    Return office visit 4 months                     Told patient to return for periodic foot care and evaluation due to potential at risk complications.   Mickey Esguerra DPM    

## 2020-11-24 ENCOUNTER — Other Ambulatory Visit: Payer: Self-pay

## 2020-11-24 ENCOUNTER — Ambulatory Visit
Admission: RE | Admit: 2020-11-24 | Discharge: 2020-11-24 | Disposition: A | Discharge status: Home / Self Care | Payer: Medicare HMO | Source: Ambulatory Visit | Attending: Family Medicine | Admitting: Family Medicine

## 2020-11-24 DIAGNOSIS — Z1231 Encounter for screening mammogram for malignant neoplasm of breast: Secondary | ICD-10-CM

## 2020-11-25 DIAGNOSIS — I10 Essential (primary) hypertension: Secondary | ICD-10-CM | POA: Diagnosis not present

## 2020-11-25 DIAGNOSIS — E1169 Type 2 diabetes mellitus with other specified complication: Secondary | ICD-10-CM | POA: Diagnosis not present

## 2020-11-25 DIAGNOSIS — E785 Hyperlipidemia, unspecified: Secondary | ICD-10-CM | POA: Diagnosis not present

## 2021-02-16 ENCOUNTER — Other Ambulatory Visit: Payer: Self-pay

## 2021-02-16 ENCOUNTER — Encounter: Payer: Self-pay | Admitting: Podiatry

## 2021-02-16 ENCOUNTER — Ambulatory Visit (INDEPENDENT_AMBULATORY_CARE_PROVIDER_SITE_OTHER): Payer: Medicare HMO | Admitting: Podiatry

## 2021-02-16 DIAGNOSIS — B351 Tinea unguium: Secondary | ICD-10-CM

## 2021-02-16 DIAGNOSIS — M79676 Pain in unspecified toe(s): Secondary | ICD-10-CM

## 2021-02-16 DIAGNOSIS — E119 Type 2 diabetes mellitus without complications: Secondary | ICD-10-CM

## 2021-02-16 NOTE — Progress Notes (Signed)
This patient returns to my office for at risk foot care.  This patient requires this care by a professional since this patient will be at risk due to having diabetes.   This patient is unable to cut nails herself since the patient cannot reach her nails.These nails are painful walking and wearing shoes.  This patient presents for at risk foot care today.  General Appearance  Alert, conversant and in no acute stress.  Vascular  Dorsalis pedis and posterior tibial  pulses are palpable  bilaterally.  Capillary return is within normal limits  bilaterally. Temperature is within normal limits  bilaterally.  Neurologic  Senn-Weinstein monofilament wire test within normal limits  bilaterally. Muscle power within normal limits bilaterally.  Nails Thick disfigured discolored nails with subungual debris  from hallux to fifth toes bilaterally. No evidence of bacterial infection or drainage bilaterally.  Orthopedic  No limitations of motion  feet .  No crepitus or effusions noted.  No bony pathology or digital deformities noted.Plantar fibroma left foot.  Skin  normotropic skin with no porokeratosis noted bilaterally.  No signs of infections or ulcers noted.     Onychomycosis  Pain in right toes  Pain in left toes  Consent was obtained for treatment procedures.   Mechanical debridement of nails 1-5  bilaterally performed with a nail nipper.  Filed with dremel without incident.    Return office visit 4 months                     Told patient to return for periodic foot care and evaluation due to potential at risk complications.   Tempest Frankland DPM    

## 2021-03-31 DIAGNOSIS — E785 Hyperlipidemia, unspecified: Secondary | ICD-10-CM | POA: Diagnosis not present

## 2021-03-31 DIAGNOSIS — E1169 Type 2 diabetes mellitus with other specified complication: Secondary | ICD-10-CM | POA: Diagnosis not present

## 2021-03-31 DIAGNOSIS — Z0000 Encounter for general adult medical examination without abnormal findings: Secondary | ICD-10-CM | POA: Diagnosis not present

## 2021-03-31 DIAGNOSIS — Z7984 Long term (current) use of oral hypoglycemic drugs: Secondary | ICD-10-CM | POA: Diagnosis not present

## 2021-03-31 DIAGNOSIS — H6123 Impacted cerumen, bilateral: Secondary | ICD-10-CM | POA: Diagnosis not present

## 2021-03-31 DIAGNOSIS — I10 Essential (primary) hypertension: Secondary | ICD-10-CM | POA: Diagnosis not present

## 2021-04-01 ENCOUNTER — Encounter: Payer: Self-pay | Admitting: Gastroenterology

## 2021-04-07 ENCOUNTER — Ambulatory Visit (AMBULATORY_SURGERY_CENTER): Payer: Medicare HMO

## 2021-04-07 ENCOUNTER — Other Ambulatory Visit: Payer: Self-pay

## 2021-04-07 VITALS — Ht 65.0 in | Wt 190.0 lb

## 2021-04-07 DIAGNOSIS — Z86010 Personal history of colonic polyps: Secondary | ICD-10-CM

## 2021-04-07 NOTE — Progress Notes (Signed)
No egg or soy allergy known to patient  ?No issues known to pt with past sedation with any surgeries or procedures ?Patient denies ever being told they had issues or difficulty with intubation  ?No FH of Malignant Hyperthermia ?Pt is not on diet pills ?Pt is not on home 02  ?Pt is not on blood thinners  ?Pt denies issues with constipation  ?No A fib or A flutter ?Pt is fully vaccinated for Covid x 2 + boosters; ?NO PA's for preps discussed with pt in PV today  ?Discussed with pt there will be an out-of-pocket cost for prep and that varies from $0 to 70 + dollars - pt verbalized understanding  ?Due to the COVID-19 pandemic we are asking patients to follow certain guidelines in PV and the [facility]   ?Pt aware of COVID protocols and [facility] guidelines  ?PV completed over the phone. Pt verified name, DOB, address and insurance during PV today.  ?Pt mailed instruction packet with copy of consent form to read and not return, and instructions.  ?Pt encouraged to call with questions or issues.  ?If pt has My chart, procedure instructions sent via My Chart  ? ?

## 2021-04-20 ENCOUNTER — Encounter: Payer: Self-pay | Admitting: Certified Registered Nurse Anesthetist

## 2021-04-22 ENCOUNTER — Encounter: Payer: Self-pay | Admitting: Gastroenterology

## 2021-04-28 ENCOUNTER — Ambulatory Visit (AMBULATORY_SURGERY_CENTER): Payer: Medicare HMO | Admitting: Gastroenterology

## 2021-04-28 ENCOUNTER — Encounter: Payer: Self-pay | Admitting: Gastroenterology

## 2021-04-28 VITALS — BP 134/65 | HR 55 | Temp 97.0°F | Resp 16 | Ht 65.0 in | Wt 190.0 lb

## 2021-04-28 DIAGNOSIS — D122 Benign neoplasm of ascending colon: Secondary | ICD-10-CM | POA: Diagnosis not present

## 2021-04-28 DIAGNOSIS — Z86010 Personal history of colonic polyps: Secondary | ICD-10-CM

## 2021-04-28 DIAGNOSIS — D120 Benign neoplasm of cecum: Secondary | ICD-10-CM | POA: Diagnosis not present

## 2021-04-28 DIAGNOSIS — I10 Essential (primary) hypertension: Secondary | ICD-10-CM | POA: Diagnosis not present

## 2021-04-28 DIAGNOSIS — D123 Benign neoplasm of transverse colon: Secondary | ICD-10-CM

## 2021-04-28 DIAGNOSIS — E119 Type 2 diabetes mellitus without complications: Secondary | ICD-10-CM | POA: Diagnosis not present

## 2021-04-28 MED ORDER — SODIUM CHLORIDE 0.9 % IV SOLN: 500.0000 mL | Freq: Once | INTRAVENOUS | Status: DC

## 2021-04-28 NOTE — Op Note (Signed)
[facility] ?Patient Name: Aimee Henderson ?Procedure Date: 04/28/2021 3:12 PM ?MRN: [id number] ?Endoscopist: Mauri Pole , MD ?Age: 76 ?Referring MD:  ?Date of Birth: Sep 23, 1945 ?Gender: Female ?Account #: [id number] ?Procedure:                Colonoscopy ?Indications:              High risk colon cancer surveillance: Personal  ?                          history of colonic polyps, High risk colon cancer  ?                          surveillance: Personal history of adenoma less than  ?                          10 mm in size ?Medicines:                Monitored Anesthesia Care ?Procedure:                Pre-Anesthesia Assessment: ?                          - Prior to the procedure, a History and Physical  ?                          was performed, and patient medications and  ?                          allergies were reviewed. The patient's tolerance of  ?                          previous anesthesia was also reviewed. The risks  ?                          and benefits of the procedure and the sedation  ?                          options and risks were discussed with the patient.  ?                          All questions were answered, and informed consent  ?                          was obtained. Prior Anticoagulants: The patient has  ?                          taken no previous anticoagulant or antiplatelet  ?                          agents. ASA Grade Assessment: II - A patient with  ?                          mild systemic disease. After reviewing the risks  ?  and benefits, the patient was deemed in  ?                          satisfactory condition to undergo the procedure. ?                          After obtaining informed consent, the colonoscope  ?                          was passed under direct vision. Throughout the  ?                          procedure, the patient's blood pressure, pulse, and  ?                          oxygen saturations were monitored  continuously. The  ?                          Olympus #[id number] Colonoscope was introduced through  ?                          the anus and advanced to the the cecum, identified  ?                          by appendiceal orifice and ileocecal valve. The  ?                          colonoscopy was performed without difficulty. The  ?                          patient tolerated the procedure well. The quality  ?                          of the bowel preparation was good. The ileocecal  ?                          valve, appendiceal orifice, and rectum were  ?                          photographed. ?Scope In: 3:17:17 PM ?Scope Out: 3:30:19 PM ?Scope Withdrawal Time: 0 hours 8 minutes 43 seconds  ?Total Procedure Duration: 0 hours 13 minutes 2 seconds  ?Findings:                 The perianal and digital rectal examinations were  ?                          normal. ?                          Four sessile polyps were found in the transverse  ?                          colon, ascending colon and cecum. The polyps were 4  ?  to 8 mm in size. These polyps were removed with a  ?                          cold snare. Resection and retrieval were complete. ?                          A few small-mouthed diverticula were found in the  ?                          sigmoid colon. ?                          Non-bleeding external and internal hemorrhoids were  ?                          found during retroflexion. The hemorrhoids were  ?                          medium-sized. ?Complications:            No immediate complications. ?Estimated Blood Loss:     Estimated blood loss was minimal. ?Impression:               - Four 4 to 8 mm polyps in the transverse colon, in  ?                          the ascending colon and in the cecum, removed with  ?                          a cold snare. Resected and retrieved. ?                          - Diverticulosis in the sigmoid colon. ?                          - Non-bleeding  external and internal hemorrhoids. ?Recommendation:           - Patient has a contact number available for  ?                          emergencies. The signs and symptoms of potential  ?                          delayed complications were discussed with the  ?                          patient. Return to normal activities tomorrow.  ?                          Written discharge instructions were provided to the  ?                          patient. ?                          - Resume previous diet. ?                          -  Continue present medications. ?                          - Await pathology results. ?                          - Repeat colonoscopy in 3 - 5 years for  ?                          surveillance based on pathology results. ?Mauri Pole, MD ?04/28/2021 3:38:15 PM ?This report has been signed electronically. ?

## 2021-04-28 NOTE — Progress Notes (Signed)
Pt's states no medical or surgical changes since previsit or office visit. 

## 2021-04-28 NOTE — Progress Notes (Signed)
D.T. vital signs. °

## 2021-04-28 NOTE — Patient Instructions (Signed)
Please read handouts provided. Continue present medications. Await pathology results.   YOU HAD AN ENDOSCOPIC PROCEDURE TODAY AT THE [facility]:   Refer to the procedure report that was given to you for any specific questions about what was found during the examination.  If the procedure report does not answer your questions, please call your gastroenterologist to clarify.  If you requested that your care partner not be given the details of your procedure findings, then the procedure report has been included in a sealed envelope for you to review at your convenience later.  YOU SHOULD EXPECT: Some feelings of bloating in the abdomen. Passage of more gas than usual.  Walking can help get rid of the air that was put into your GI tract during the procedure and reduce the bloating. If you had a lower endoscopy (such as a colonoscopy or flexible sigmoidoscopy) you may notice spotting of blood in your stool or on the toilet paper. If you underwent a bowel prep for your procedure, you may not have a normal bowel movement for a few days.  Please Note:  You might notice some irritation and congestion in your nose or some drainage.  This is from the oxygen used during your procedure.  There is no need for concern and it should clear up in a day or so.  SYMPTOMS TO REPORT IMMEDIATELY:  Following lower endoscopy (colonoscopy or flexible sigmoidoscopy):  Excessive amounts of blood in the stool  Significant tenderness or worsening of abdominal pains  Swelling of the abdomen that is new, acute  Fever of 100F or higher   For urgent or emergent issues, a gastroenterologist can be reached at any hour by calling [phone]. Do not use MyChart messaging for urgent concerns.    DIET:  We do recommend a small meal at first, but then you may proceed to your regular diet.  Drink plenty of fluids but you should avoid alcoholic beverages for 24 hours.  ACTIVITY:  You should plan to take it easy  for the rest of today and you should NOT DRIVE or use heavy machinery until tomorrow (because of the sedation medicines used during the test).    FOLLOW UP: Our staff will call the number listed on your records 48-72 hours following your procedure to check on you and address any questions or concerns that you may have regarding the information given to you following your procedure. If we do not reach you, we will leave a message.  We will attempt to reach you two times.  During this call, we will ask if you have developed any symptoms of COVID 19. If you develop any symptoms (ie: fever, flu-like symptoms, shortness of breath, cough etc.) before then, please call [phone].  If you test positive for Covid 19 in the 2 weeks post procedure, please call and report this information to us.    If any biopsies were taken you will be contacted by phone or by letter within the next 1-3 weeks.  Please call us at [phone] if you have not heard about the biopsies in 3 weeks.    SIGNATURES/CONFIDENTIALITY: You and/or your care partner have signed paperwork which will be entered into your electronic medical record.  These signatures attest to the fact that that the information above on your After Visit Summary has been reviewed and is understood.  Full responsibility of the confidentiality of this discharge information lies with you and/or your care-partner.  

## 2021-04-28 NOTE — Progress Notes (Signed)
Report given to PACU, vss 

## 2021-04-28 NOTE — Progress Notes (Signed)
[facility] History and Physical ? ? ?Primary Care Physician:  Harlan Stains, MD ? ? ?Reason for Procedure:  History of adenomatous colon polyps ? ?Plan:    Surveillance colonoscopy with possible interventions as needed ? ? ? ? ?HPI: Aimee Henderson is a very pleasant 76 y.o. female here for surveillance colonoscopy. ?Denies any nausea, vomiting, abdominal pain, melena or bright red blood per rectum ? ?The risks and benefits as well as alternatives of endoscopic procedure(s) have been discussed and reviewed. All questions answered. The patient agrees to proceed. ? ? ? ?Past Medical History:  ?Diagnosis Date  ? Bleeding from the nose   ? Cataract   ? not a surgical candidate at this time (04/07/2021)  ? Diabetes mellitus without complication ([place])   ? on meds  ? Hyperlipidemia   ? on meds  ? Hypertension   ? on meds  ? Rectal bleeding 03/11/2015  ? occasionally, pinkish tint per pt   ? ? ?Past Surgical History:  ?Procedure Laterality Date  ? ABDOMINAL HYSTERECTOMY    ? BREAST BIOPSY Left 07/31/2014  ? COLONOSCOPY  2006  ? COLONOSCOPY  2017  ? KN-suprep (good)-TA  ? POLYPECTOMY  2017  ? TA  ? ? ?Prior to Admission medications   ?Medication Sig Start Date End Date Taking? Authorizing Provider  ?Accu-Chek FastClix Lancets MISC For use when checking blood sugars three times daily, E11.65 09/24/14   [provider]  ?Jerold Coombe test strip  10/10/17   [provider]  ?Alcohol Swabs (B-D SINGLE USE SWABS REGULAR) PADS daily. 06/07/12   [provider]  ?amLODipine (NORVASC) 10 MG tablet Take 10 mg by mouth daily. 10/22/17   [provider]  ?atorvastatin (LIPITOR) 20 MG tablet Take 20 mg by mouth daily. 06/13/20   [provider]  ?Blood Glucose Monitoring Suppl (ACCU-CHEK NANO SMARTVIEW) w/Device KIT See admin instructions. 02/18/14   [provider]  ?empagliflozin (JARDIANCE) 10 MG TABS tablet Take 1 tablet by mouth daily at 6 (six) AM. 04/01/21    [provider]  ?ibuprofen (ADVIL,MOTRIN) 400 MG tablet Take 1 tablet (400 mg total) by mouth every 6 (six) hours as needed. 05/11/14   Cartner, Marland Kitchen, PA-C  ?metFORMIN (GLUCOPHAGE-XR) 500 MG 24 hr tablet Take 1,000 mg by mouth 2 (two) times daily. 01/05/18   [provider]  ?metoprolol (LOPRESSOR) 100 MG tablet Take 100 mg by mouth 2 (two) times daily.    [provider]  ?Multiple Vitamin (MULTIVITAMIN WITH MINERALS) TABS Take 1 tablet by mouth daily.    [provider]  ?ODORLESS GARLIC PO Take 1 capsule by mouth daily at 6 (six) AM.    [provider]  ?valsartan-hydrochlorothiazide (DIOVAN-HCT) 320-25 MG tablet Take 1 tablet by mouth daily. 08/19/20   [provider]  ? ? ?Current Outpatient Medications  ?Medication Sig Dispense Refill  ? Accu-Chek FastClix Lancets MISC For use when checking blood sugars three times daily, E11.65    ? ACCU-CHEK SMARTVIEW test strip     ? Alcohol Swabs (B-D SINGLE USE SWABS REGULAR) PADS daily.    ? amLODipine (NORVASC) 10 MG tablet Take 10 mg by mouth daily.    ? atorvastatin (LIPITOR) 20 MG tablet Take 20 mg by mouth daily.    ? Blood Glucose Monitoring Suppl (ACCU-CHEK NANO SMARTVIEW) w/Device KIT See admin instructions.    ? empagliflozin (JARDIANCE) 10 MG TABS tablet Take 1 tablet by mouth daily at 6 (six) AM.    ?  ibuprofen (ADVIL,MOTRIN) 400 MG tablet Take 1 tablet (400 mg total) by mouth every 6 (six) hours as needed. 30 tablet 0  ? metFORMIN (GLUCOPHAGE-XR) 500 MG 24 hr tablet Take 1,000 mg by mouth 2 (two) times daily.    ? metoprolol (LOPRESSOR) 100 MG tablet Take 100 mg by mouth 2 (two) times daily.    ? Multiple Vitamin (MULTIVITAMIN WITH MINERALS) TABS Take 1 tablet by mouth daily.    ? ODORLESS GARLIC PO Take 1 capsule by mouth daily at 6 (six) AM.    ? valsartan-hydrochlorothiazide (DIOVAN-HCT) 320-25 MG tablet Take 1 tablet by mouth daily.    ? ?Current Facility-Administered Medications  ?Medication Dose  Route Frequency Provider Last Rate Last Admin  ? 0.9 %  sodium chloride infusion  500 mL Intravenous Once Tateanna Bach, Venia Minks, MD      ? ? ?Allergies as of 04/28/2021 - Review Complete 04/27/2021  ?Allergen Reaction Noted  ? Acetaminophen Other (See Comments) 03/16/2012  ? Dapagliflozin Diarrhea 12/11/2019  ? Latex Hives 03/16/2012  ? Lisinopril Other (See Comments) 12/11/2019  ? Semaglutide Nausea And Vomiting 03/31/2021  ? ? ?Family History  ?Problem Relation Age of Onset  ? Breast cancer Mother   ?     LEFT  ? Colon cancer Neg Hx   ? Colon polyps Neg Hx   ? Esophageal cancer Neg Hx   ? Prostate cancer Neg Hx   ? Rectal cancer Neg Hx   ? Stomach cancer Neg Hx   ? ? ?Social History  ? ?Socioeconomic History  ? Marital status: Single  ?  Spouse name: Not on file  ? Number of children: Not on file  ? Years of education: Not on file  ? Highest education level: Not on file  ?Occupational History  ? Not on file  ?Tobacco Use  ? Smoking status: Never  ? Smokeless tobacco: Never  ?Vaping Use  ? Vaping Use: Never used  ?Substance and Sexual Activity  ? Alcohol use: No  ?  Alcohol/week: 0.0 standard drinks  ? Drug use: No  ? Sexual activity: Not on file  ?Other Topics Concern  ? Not on file  ?Social History Narrative  ? Not on file  ? ?Social Determinants of Health  ? ?Financial Resource Strain: Not on file  ?Food Insecurity: Not on file  ?Transportation Needs: Not on file  ?Physical Activity: Not on file  ?Stress: Not on file  ?Social Connections: Not on file  ?Intimate Partner Violence: Not on file  ? ? ?Review of Systems: ? ?All other review of systems negative except as mentioned in the HPI. ? ?Physical Exam: ?Vital signs in last 24 hours: ?BP (!) 125/43   Pulse 60   Ht '5\' 5"'  (1.651 m)   Wt 190 lb (86.2 kg)   SpO2 99%   BMI 31.62 kg/m?  ?General:   Alert, NAD ?Lungs:  Clear .   ?Heart:  Regular rate and rhythm ?Abdomen:  Soft, nontender and nondistended. ?Neuro/Psych:  Alert and cooperative. Normal mood and affect.  A and O x 3 ? ?Reviewed labs, radiology imaging, old records and pertinent past GI work up ? ?Patient is appropriate for planned procedure(s) and anesthesia in an ambulatory setting ? ? ?K. Denzil Magnuson , MD ?[phone]  ? ? ?  ?

## 2021-04-28 NOTE — Progress Notes (Signed)
Called to room to assist during endoscopic procedure.  Patient ID and intended procedure confirmed with present staff. Received instructions for my participation in the procedure from the performing physician.  

## 2021-04-30 ENCOUNTER — Telehealth: Payer: Self-pay

## 2021-04-30 NOTE — Telephone Encounter (Signed)
?  Follow up Call- ? ? ?  04/28/2021  ?  2:58 PM  ?Call back number  ?Post procedure Call Back phone  # [phone]  ?Permission to leave phone message Yes  ?  ? ?Patient questions: ? ?Do you have a fever, pain , or abdominal swelling? No. ?Pain Score  0 * ? ?Have you tolerated food without any problems? Yes.   ? ?Have you been able to return to your normal activities? Yes.   ? ?Do you have any questions about your discharge instructions: ?Diet   No. ?Medications  No. ?Follow up visit  No. ? ?Do you have questions or concerns about your Care? No. ? ?Actions: ?* If pain score is 4 or above: ?No action needed, pain <4. ? ?Have you developed a fever since your procedure? no ? ?2.   Have you had an respiratory symptoms (SOB or cough) since your procedure? no ? ?3.   Have you tested positive for COVID 19 since your procedure no ? ?4.   Have you had any family members/close contacts diagnosed with the COVID 19 since your procedure?  no ? ? ?If yes to any of these questions please route to Joylene John, RN and Joella Prince, RN  ? ? ?

## 2021-05-06 ENCOUNTER — Encounter: Payer: Self-pay | Admitting: Gastroenterology

## 2021-05-24 DIAGNOSIS — E785 Hyperlipidemia, unspecified: Secondary | ICD-10-CM | POA: Diagnosis not present

## 2021-05-24 DIAGNOSIS — E1169 Type 2 diabetes mellitus with other specified complication: Secondary | ICD-10-CM | POA: Diagnosis not present

## 2021-05-24 DIAGNOSIS — I10 Essential (primary) hypertension: Secondary | ICD-10-CM | POA: Diagnosis not present

## 2021-06-16 ENCOUNTER — Ambulatory Visit (INDEPENDENT_AMBULATORY_CARE_PROVIDER_SITE_OTHER): Payer: Medicare HMO | Admitting: Podiatry

## 2021-06-16 ENCOUNTER — Encounter: Payer: Self-pay | Admitting: Podiatry

## 2021-06-16 DIAGNOSIS — M79676 Pain in unspecified toe(s): Secondary | ICD-10-CM

## 2021-06-16 DIAGNOSIS — E119 Type 2 diabetes mellitus without complications: Secondary | ICD-10-CM | POA: Diagnosis not present

## 2021-06-16 DIAGNOSIS — B351 Tinea unguium: Secondary | ICD-10-CM | POA: Diagnosis not present

## 2021-06-16 NOTE — Progress Notes (Signed)
This patient returns to my office for at risk foot care.  This patient requires this care by a professional since this patient will be at risk due to having diabetes.   This patient is unable to cut nails herself since the patient cannot reach her nails.These nails are painful walking and wearing shoes.  This patient presents for at risk foot care today.  General Appearance  Alert, conversant and in no acute stress.  Vascular  Dorsalis pedis and posterior tibial  pulses are palpable  bilaterally.  Capillary return is within normal limits  bilaterally. Temperature is within normal limits  bilaterally.  Neurologic  Senn-Weinstein monofilament wire test within normal limits  bilaterally. Muscle power within normal limits bilaterally.  Nails Thick disfigured discolored nails with subungual debris  from hallux to fifth toes bilaterally. No evidence of bacterial infection or drainage bilaterally.  Orthopedic  No limitations of motion  feet .  No crepitus or effusions noted.  No bony pathology or digital deformities noted.Plantar fibroma left foot.  Skin  normotropic skin with no porokeratosis noted bilaterally.  No signs of infections or ulcers noted.     Onychomycosis  Pain in right toes  Pain in left toes  Consent was obtained for treatment procedures.   Mechanical debridement of nails 1-5  bilaterally performed with a nail nipper.  Filed with dremel without incident.    Return office visit 4 months                     Told patient to return for periodic foot care and evaluation due to potential at risk complications.   Aiyanna Awtrey DPM    

## 2021-10-05 DIAGNOSIS — E785 Hyperlipidemia, unspecified: Secondary | ICD-10-CM | POA: Diagnosis not present

## 2021-10-05 DIAGNOSIS — E669 Obesity, unspecified: Secondary | ICD-10-CM | POA: Diagnosis not present

## 2021-10-05 DIAGNOSIS — E1169 Type 2 diabetes mellitus with other specified complication: Secondary | ICD-10-CM | POA: Diagnosis not present

## 2021-10-05 DIAGNOSIS — I868 Varicose veins of other specified sites: Secondary | ICD-10-CM | POA: Diagnosis not present

## 2021-10-05 DIAGNOSIS — I10 Essential (primary) hypertension: Secondary | ICD-10-CM | POA: Diagnosis not present

## 2021-10-13 DIAGNOSIS — H524 Presbyopia: Secondary | ICD-10-CM | POA: Diagnosis not present

## 2021-10-13 DIAGNOSIS — H52203 Unspecified astigmatism, bilateral: Secondary | ICD-10-CM | POA: Diagnosis not present

## 2021-10-13 DIAGNOSIS — H2513 Age-related nuclear cataract, bilateral: Secondary | ICD-10-CM | POA: Diagnosis not present

## 2021-10-13 DIAGNOSIS — H5213 Myopia, bilateral: Secondary | ICD-10-CM | POA: Diagnosis not present

## 2021-10-13 DIAGNOSIS — E119 Type 2 diabetes mellitus without complications: Secondary | ICD-10-CM | POA: Diagnosis not present

## 2021-10-13 DIAGNOSIS — H04211 Epiphora due to excess lacrimation, right lacrimal gland: Secondary | ICD-10-CM | POA: Diagnosis not present

## 2021-10-18 ENCOUNTER — Ambulatory Visit: Payer: Medicare HMO | Admitting: Podiatry

## 2021-10-18 ENCOUNTER — Encounter: Payer: Self-pay | Admitting: Podiatry

## 2021-10-18 DIAGNOSIS — M79676 Pain in unspecified toe(s): Secondary | ICD-10-CM | POA: Diagnosis not present

## 2021-10-18 DIAGNOSIS — E119 Type 2 diabetes mellitus without complications: Secondary | ICD-10-CM

## 2021-10-18 DIAGNOSIS — B351 Tinea unguium: Secondary | ICD-10-CM | POA: Diagnosis not present

## 2021-10-18 NOTE — Progress Notes (Signed)
This patient returns to my office for at risk foot care.  This patient requires this care by a professional since this patient will be at risk due to having diabetes.   This patient is unable to cut nails herself since the patient cannot reach her nails.These nails are painful walking and wearing shoes.  This patient presents for at risk foot care today.  General Appearance  Alert, conversant and in no acute stress.  Vascular  Dorsalis pedis and posterior tibial  pulses are palpable  bilaterally.  Capillary return is within normal limits  bilaterally. Temperature is within normal limits  bilaterally.  Neurologic  Senn-Weinstein monofilament wire test within normal limits  bilaterally. Muscle power within normal limits bilaterally.  Nails Thick disfigured discolored nails with subungual debris  from hallux to fifth toes bilaterally. No evidence of bacterial infection or drainage bilaterally.  Orthopedic  No limitations of motion  feet .  No crepitus or effusions noted.  No bony pathology or digital deformities noted.Plantar fibroma left foot.  Skin  normotropic skin with no porokeratosis noted bilaterally.  No signs of infections or ulcers noted.     Onychomycosis  Pain in right toes  Pain in left toes  Consent was obtained for treatment procedures.   Mechanical debridement of nails 1-5  bilaterally performed with a nail nipper.  Filed with dremel without incident.    Return office visit 4 months                     Told patient to return for periodic foot care and evaluation due to potential at risk complications.   Vaishnav Demartin DPM    

## 2021-11-05 ENCOUNTER — Other Ambulatory Visit: Payer: Self-pay | Admitting: Family Medicine

## 2021-11-05 DIAGNOSIS — Z1231 Encounter for screening mammogram for malignant neoplasm of breast: Secondary | ICD-10-CM

## 2021-12-06 ENCOUNTER — Ambulatory Visit
Admission: RE | Admit: 2021-12-06 | Discharge: 2021-12-06 | Disposition: A | Discharge status: Home / Self Care | Payer: Medicare HMO | Source: Ambulatory Visit | Attending: Family Medicine | Admitting: Family Medicine

## 2021-12-06 DIAGNOSIS — Z1231 Encounter for screening mammogram for malignant neoplasm of breast: Secondary | ICD-10-CM

## 2022-01-14 DIAGNOSIS — E1169 Type 2 diabetes mellitus with other specified complication: Secondary | ICD-10-CM | POA: Diagnosis not present

## 2022-01-17 DIAGNOSIS — I10 Essential (primary) hypertension: Secondary | ICD-10-CM | POA: Diagnosis not present

## 2022-01-17 DIAGNOSIS — E1169 Type 2 diabetes mellitus with other specified complication: Secondary | ICD-10-CM | POA: Diagnosis not present

## 2022-01-17 DIAGNOSIS — E785 Hyperlipidemia, unspecified: Secondary | ICD-10-CM | POA: Diagnosis not present

## 2022-02-22 ENCOUNTER — Encounter: Payer: Self-pay | Admitting: Podiatry

## 2022-02-22 ENCOUNTER — Ambulatory Visit: Payer: Medicare HMO | Admitting: Podiatry

## 2022-02-22 DIAGNOSIS — B351 Tinea unguium: Secondary | ICD-10-CM | POA: Diagnosis not present

## 2022-02-22 DIAGNOSIS — M79676 Pain in unspecified toe(s): Secondary | ICD-10-CM | POA: Diagnosis not present

## 2022-02-22 DIAGNOSIS — E119 Type 2 diabetes mellitus without complications: Secondary | ICD-10-CM

## 2022-02-22 NOTE — Progress Notes (Signed)
This patient returns to my office for at risk foot care.  This patient requires this care by a professional since this patient will be at risk due to having diabetes.   This patient is unable to cut nails herself since the patient cannot reach her nails.These nails are painful walking and wearing shoes.  This patient presents for at risk foot care today.  General Appearance  Alert, conversant and in no acute stress.  Vascular  Dorsalis pedis and posterior tibial  pulses are palpable  bilaterally.  Capillary return is within normal limits  bilaterally. Temperature is within normal limits  bilaterally.  Neurologic  Senn-Weinstein monofilament wire test within normal limits  bilaterally. Muscle power within normal limits bilaterally.  Nails Thick disfigured discolored nails with subungual debris  from hallux to fifth toes bilaterally. No evidence of bacterial infection or drainage bilaterally.  Orthopedic  No limitations of motion  feet .  No crepitus or effusions noted.  No bony pathology or digital deformities noted.Plantar fibroma left foot.  Skin  normotropic skin with no porokeratosis noted bilaterally.  No signs of infections or ulcers noted.     Onychomycosis  Pain in right toes  Pain in left toes  Consent was obtained for treatment procedures.   Mechanical debridement of nails 1-5  bilaterally performed with a nail nipper.  Filed with dremel without incident.    Return office visit 4 months                     Told patient to return for periodic foot care and evaluation due to potential at risk complications.   Gardiner Barefoot DPM

## 2022-04-22 DIAGNOSIS — J309 Allergic rhinitis, unspecified: Secondary | ICD-10-CM | POA: Diagnosis not present

## 2022-04-22 DIAGNOSIS — Z0000 Encounter for general adult medical examination without abnormal findings: Secondary | ICD-10-CM | POA: Diagnosis not present

## 2022-04-22 DIAGNOSIS — E1169 Type 2 diabetes mellitus with other specified complication: Secondary | ICD-10-CM | POA: Diagnosis not present

## 2022-04-22 DIAGNOSIS — I10 Essential (primary) hypertension: Secondary | ICD-10-CM | POA: Diagnosis not present

## 2022-04-22 DIAGNOSIS — E119 Type 2 diabetes mellitus without complications: Secondary | ICD-10-CM | POA: Diagnosis not present

## 2022-04-22 DIAGNOSIS — E669 Obesity, unspecified: Secondary | ICD-10-CM | POA: Diagnosis not present

## 2022-04-22 DIAGNOSIS — E785 Hyperlipidemia, unspecified: Secondary | ICD-10-CM | POA: Diagnosis not present

## 2022-04-22 DIAGNOSIS — B372 Candidiasis of skin and nail: Secondary | ICD-10-CM | POA: Diagnosis not present

## 2022-05-16 DIAGNOSIS — Z23 Encounter for immunization: Secondary | ICD-10-CM | POA: Diagnosis not present

## 2022-05-16 DIAGNOSIS — N289 Disorder of kidney and ureter, unspecified: Secondary | ICD-10-CM | POA: Diagnosis not present

## 2022-06-27 ENCOUNTER — Encounter: Payer: Self-pay | Admitting: Podiatry

## 2022-06-27 ENCOUNTER — Ambulatory Visit (INDEPENDENT_AMBULATORY_CARE_PROVIDER_SITE_OTHER): Payer: Medicare HMO | Admitting: Podiatry

## 2022-06-27 DIAGNOSIS — E119 Type 2 diabetes mellitus without complications: Secondary | ICD-10-CM | POA: Diagnosis not present

## 2022-06-27 DIAGNOSIS — B351 Tinea unguium: Secondary | ICD-10-CM

## 2022-06-27 DIAGNOSIS — M79676 Pain in unspecified toe(s): Secondary | ICD-10-CM | POA: Diagnosis not present

## 2022-06-27 NOTE — Progress Notes (Signed)
This patient returns to my office for at risk foot care.  This patient requires this care by a professional since this patient will be at risk due to having diabetes.   This patient is unable to cut nails herself since the patient cannot reach her nails.These nails are painful walking and wearing shoes.  This patient presents for at risk foot care today. ° °General Appearance  Alert, conversant and in no acute stress. ° °Vascular  Dorsalis pedis and posterior tibial  pulses are palpable  bilaterally.  Capillary return is within normal limits  bilaterally. Temperature is within normal limits  bilaterally. ° °Neurologic  Senn-Weinstein monofilament wire test within normal limits  bilaterally. Muscle power within normal limits bilaterally. ° °Nails Thick disfigured discolored nails with subungual debris  from hallux to fifth toes bilaterally. No evidence of bacterial infection or drainage bilaterally. ° °Orthopedic  No limitations of motion  feet .  No crepitus or effusions noted.  No bony pathology or digital deformities noted.Plantar fibroma left foot. ° °Skin  normotropic skin with no porokeratosis noted bilaterally.  No signs of infections or ulcers noted.    ° °Onychomycosis  Pain in right toes  Pain in left toes ° °Consent was obtained for treatment procedures.   Mechanical debridement of nails 1-5  bilaterally performed with a nail nipper.  Filed with dremel without incident.  ° ° °Return office visit 4 months                     Told patient to return for periodic foot care and evaluation due to potential at risk complications. ° ° °Isabel Freese DPM    °

## 2022-06-28 ENCOUNTER — Ambulatory Visit: Payer: Medicare HMO | Admitting: Podiatry

## 2022-06-28 ENCOUNTER — Ambulatory Visit (INDEPENDENT_AMBULATORY_CARE_PROVIDER_SITE_OTHER): Payer: Medicare HMO

## 2022-06-28 DIAGNOSIS — M722 Plantar fascial fibromatosis: Secondary | ICD-10-CM | POA: Diagnosis not present

## 2022-06-28 MED ORDER — DEXAMETHASONE SODIUM PHOSPHATE 120 MG/30ML IJ SOLN
4.0000 mg | Freq: Once | INTRAMUSCULAR | Status: AC
  Administered 2022-06-28: 4 mg via INTRA_ARTICULAR

## 2022-06-28 MED ORDER — MELOXICAM 15 MG PO TABS: 15.0000 mg | ORAL_TABLET | Freq: Every day | ORAL | 0 refills | Status: AC

## 2022-06-28 NOTE — Progress Notes (Signed)
  Subjective:  Patient ID: Aimee Henderson, female    DOB: 08-17-1945,   MRN: [id number]  Chief Complaint  Patient presents with   Foot Pain     Left heel pain on going for 2 weeks , pt has a history of pf pain    77 y.o. female presents for concern of left heel pain that has been ongoing for about tow weeks. She does have a history of plantar fasciitis and she is diabetic. Relates it started about two days ago and had severe pain that has started to improve after having shrimp the night before.  . Denies any other pedal complaints. Denies n/v/f/c.   Past Medical History:  Diagnosis Date   Bleeding from the nose    Cataract    not a surgical candidate at this time (04/07/2021)   Diabetes mellitus without complication (HCC)    on meds   Hyperlipidemia    on meds   Hypertension    on meds   Rectal bleeding 03/11/2015   occasionally, pinkish tint per pt     Objective:  Physical Exam: Vascular: DP/PT pulses 2/4 bilateral. CFT <3 seconds. Normal hair growth on digits. No edema.  Skin. No lacerations or abrasions bilateral feet.  Musculoskeletal: MMT 5/5 bilateral lower extremities in DF, PF, Inversion and Eversion. Deceased ROM in DF of ankle joint. Tender to left medial calcaneal tubercle. No pain along arch. No pain along achilles. No pain with calcaneal squeeze.  Neurological: Sensation intact to light touch.   Assessment:   1. Plantar fasciitis, left      Plan:  Patient was evaluated and treated and all questions answered. Discussed plantar fasciitis with patient.  X-rays reviewed and discussed with patient. No acute fractures or dislocations noted. Mild spurring noted at inferior calcaneus.  Discussed treatment options including, ice, NSAIDS, supportive shoes, bracing, and stretching. Stretching exercises provided to be done on a daily basis.   Prescription for meloxicam provided and sent to pharmacy. Patient requesting injection today. Procedure note below.   PF  brace dispensed.  Discussed can't rule out gout as well.  Follow-up 6 weeks or sooner if any problems arise. In the meantime, encouraged to call the office with any questions, concerns, change in symptoms.   Procedure:  Discussed etiology, pathology, conservative vs. surgical therapies. At this time a plantar fascial injection was recommended.  The patient agreed and a sterile skin prep was applied.  An injection consisting of  dexamethasone and marcaine mixture was infiltrated at the point of maximal tenderness on the left Heel.  Bandaid applied. The patient tolerated this well and was given instructions for aftercare.    Louann Sjogren, DPM

## 2022-06-28 NOTE — Patient Instructions (Signed)

## 2022-07-20 DIAGNOSIS — E119 Type 2 diabetes mellitus without complications: Secondary | ICD-10-CM | POA: Diagnosis not present

## 2022-07-20 DIAGNOSIS — Z23 Encounter for immunization: Secondary | ICD-10-CM | POA: Diagnosis not present

## 2022-07-20 DIAGNOSIS — I10 Essential (primary) hypertension: Secondary | ICD-10-CM | POA: Diagnosis not present

## 2022-07-20 DIAGNOSIS — Z9989 Dependence on other enabling machines and devices: Secondary | ICD-10-CM | POA: Diagnosis not present

## 2022-07-20 DIAGNOSIS — E1169 Type 2 diabetes mellitus with other specified complication: Secondary | ICD-10-CM | POA: Diagnosis not present

## 2022-07-20 DIAGNOSIS — E669 Obesity, unspecified: Secondary | ICD-10-CM | POA: Diagnosis not present

## 2022-07-20 DIAGNOSIS — E785 Hyperlipidemia, unspecified: Secondary | ICD-10-CM | POA: Diagnosis not present

## 2022-08-09 ENCOUNTER — Encounter: Payer: Self-pay | Admitting: Podiatry

## 2022-08-09 ENCOUNTER — Ambulatory Visit: Payer: Medicare HMO | Admitting: Podiatry

## 2022-08-09 DIAGNOSIS — E1142 Type 2 diabetes mellitus with diabetic polyneuropathy: Secondary | ICD-10-CM

## 2022-08-09 DIAGNOSIS — M722 Plantar fascial fibromatosis: Secondary | ICD-10-CM

## 2022-08-09 NOTE — Progress Notes (Signed)
  Subjective:  Patient ID: Aimee Henderson, female    DOB: 05-Aug-1945,   MRN: [id number]  Chief Complaint  Patient presents with   Plantar Fasciitis    Patient states left foot is doing much better since the last visit , states some pain is still there but not like the last visit . Patient states right foot is starting to become painful but not like the left foot    77 y.o. female presents for follow-up of left foot plantar fasciitis. Relates the left foot is doing much better but now starting to get some pain in the right foot.  She is diabetic. Marland KitchenRelates some tingling in feet. Wondering about diabetic shoes.   . Denies any other pedal complaints. Denies n/v/f/c.   Past Medical History:  Diagnosis Date   Bleeding from the nose    Cataract    not a surgical candidate at this time (04/07/2021)   Diabetes mellitus without complication (HCC)    on meds   Hyperlipidemia    on meds   Hypertension    on meds   Rectal bleeding 03/11/2015   occasionally, pinkish tint per pt     Objective:  Physical Exam: Vascular: DP/PT pulses 2/4 bilateral. CFT <3 seconds. Normal hair growth on digits. No edema.  Skin. No lacerations or abrasions bilateral feet.  Musculoskeletal: MMT 5/5 bilateral lower extremities in DF, PF, Inversion and Eversion. Deceased ROM in DF of ankle joint. Minimally tender to left medial calcaneal tubercle. No pain along arch. No pain along achilles. No pain with calcaneal squeeze.  Neurological: Sensation intact to light touch. Protective sensation slightly dimisinshed.   Assessment:   1. Plantar fasciitis, left   2. Type 2 diabetes mellitus with diabetic polyneuropathy, without long-term current use of insulin (HCC)       Plan:  Patient was evaluated and treated and all questions answered. Discussed plantar fasciitis with patient.  X-rays reviewed and discussed with patient. No acute fractures or dislocations noted. Mild spurring noted at inferior calcaneus.   Discussed treatment options including, ice, NSAIDS, supportive shoes, bracing, and stretching.  Continue stretching and support and meloxicam as needed DM shoe appointment  Discussed can't rule out gout as well.  Follow-up as needed  .    Louann Sjogren, DPM

## 2022-09-05 ENCOUNTER — Ambulatory Visit: Payer: Medicare HMO

## 2022-09-05 NOTE — Progress Notes (Signed)
Patient presents to the office today for diabetic shoe and insole measuring.  Patient was measured with brannock device to determine size and width for 1 pair of extra depth shoes and foam casted for 3 pair of insoles.   Documentation of medical necessity will be sent to patient's treating diabetic doctor to verify and sign.   Patient's diabetic provider: Laurann Montana  Shoes and insoles will be ordered at that time and patient will be notified for an appointment for fitting when they arrive.   Shoe size (per patient): 9   Brannock measurement: 9.5  Patient shoe selection-   Shoe choice:   [id number]  Shoe size ordered: 9.5 W

## 2022-10-24 ENCOUNTER — Ambulatory Visit: Payer: Medicare HMO | Admitting: Podiatry

## 2022-10-28 ENCOUNTER — Other Ambulatory Visit: Payer: Self-pay | Admitting: Family Medicine

## 2022-10-28 DIAGNOSIS — Z0000 Encounter for general adult medical examination without abnormal findings: Secondary | ICD-10-CM

## 2022-10-31 ENCOUNTER — Encounter: Payer: Self-pay | Admitting: Podiatry

## 2022-10-31 ENCOUNTER — Ambulatory Visit: Payer: Medicare HMO | Admitting: Podiatry

## 2022-10-31 DIAGNOSIS — B351 Tinea unguium: Secondary | ICD-10-CM | POA: Diagnosis not present

## 2022-10-31 DIAGNOSIS — E1142 Type 2 diabetes mellitus with diabetic polyneuropathy: Secondary | ICD-10-CM | POA: Diagnosis not present

## 2022-10-31 DIAGNOSIS — M79676 Pain in unspecified toe(s): Secondary | ICD-10-CM

## 2022-10-31 DIAGNOSIS — M722 Plantar fascial fibromatosis: Secondary | ICD-10-CM | POA: Diagnosis not present

## 2022-10-31 NOTE — Progress Notes (Addendum)
This patient returns to my office for at risk foot care.  This patient requires this care by a professional since this patient will be at risk due to having diabetes.   This patient is unable to cut nails herself since the patient cannot reach her nails.These nails are painful walking and wearing shoes.  This patient presents for at risk foot care today.  General Appearance  Alert, conversant and in no acute stress.  Vascular  Dorsalis pedis and posterior tibial  pulses are palpable  bilaterally.  Capillary return is within normal limits  bilaterally. Temperature is within normal limits  bilaterally.  Neurologic  Senn-Weinstein monofilament wire test within normal limits  bilaterally. Muscle power within normal limits bilaterally.  Nails Thick disfigured discolored nails with subungual debris  from hallux to fifth toes bilaterally. No evidence of bacterial infection or drainage bilaterally.  Orthopedic  No limitations of motion  feet .  No crepitus or effusions noted.  No bony pathology or digital deformities noted.Plantar fibroma left foot.  Skin  normotropic skin with no porokeratosis noted bilaterally.  No signs of infections or ulcers noted.     Onychomycosis  Pain in right toes  Pain in left toes  Consent was obtained for treatment procedures.   Mechanical debridement of nails 1-5  bilaterally performed with a nail nipper.  Filed with dremel without incident. Dispense diabetic shoes..diabeticshoes   Return office visit 4 months                     Told patient to return for periodic foot care and evaluation due to potential at risk complications.   Helane Gunther DPM

## 2022-12-08 ENCOUNTER — Ambulatory Visit
Admission: RE | Admit: 2022-12-08 | Discharge: 2022-12-08 | Disposition: A | Discharge status: Home / Self Care | Payer: Medicare HMO | Source: Ambulatory Visit | Attending: Family Medicine | Admitting: Family Medicine

## 2022-12-08 DIAGNOSIS — Z0000 Encounter for general adult medical examination without abnormal findings: Secondary | ICD-10-CM

## 2022-12-08 DIAGNOSIS — Z1231 Encounter for screening mammogram for malignant neoplasm of breast: Secondary | ICD-10-CM | POA: Diagnosis not present

## 2022-12-13 ENCOUNTER — Other Ambulatory Visit: Payer: Self-pay | Admitting: Family Medicine

## 2022-12-13 DIAGNOSIS — R928 Other abnormal and inconclusive findings on diagnostic imaging of breast: Secondary | ICD-10-CM

## 2022-12-21 ENCOUNTER — Other Ambulatory Visit: Payer: Self-pay | Admitting: Family Medicine

## 2022-12-21 ENCOUNTER — Ambulatory Visit
Admission: RE | Admit: 2022-12-21 | Discharge: 2022-12-21 | Disposition: A | Discharge status: Home / Self Care | Payer: Medicare HMO | Source: Ambulatory Visit | Attending: Family Medicine | Admitting: Family Medicine

## 2022-12-21 DIAGNOSIS — R928 Other abnormal and inconclusive findings on diagnostic imaging of breast: Secondary | ICD-10-CM

## 2022-12-21 DIAGNOSIS — R921 Mammographic calcification found on diagnostic imaging of breast: Secondary | ICD-10-CM | POA: Diagnosis not present

## 2022-12-23 ENCOUNTER — Ambulatory Visit
Admission: RE | Admit: 2022-12-23 | Discharge: 2022-12-23 | Disposition: A | Discharge status: Home / Self Care | Payer: Medicare HMO | Source: Ambulatory Visit | Attending: Family Medicine | Admitting: Family Medicine

## 2022-12-23 DIAGNOSIS — N6012 Diffuse cystic mastopathy of left breast: Secondary | ICD-10-CM | POA: Diagnosis not present

## 2022-12-23 DIAGNOSIS — R921 Mammographic calcification found on diagnostic imaging of breast: Secondary | ICD-10-CM

## 2022-12-23 HISTORY — PX: BREAST BIOPSY: SHX20

## 2022-12-26 LAB — SURGICAL PATHOLOGY

## 2023-02-15 IMAGING — MG MM DIGITAL SCREENING BILAT W/ TOMO AND CAD
8 series · 8 of 24 positions shown · non-contrast
Comparison: Previous exam(s).

CLINICAL DATA: Screening.

EXAM:
DIGITAL SCREENING BILATERAL MAMMOGRAM WITH TOMOSYNTHESIS AND CAD
TECHNIQUE: Bilateral screening digital craniocaudal and mediolateral oblique
mammograms were obtained. Bilateral screening digital breast
tomosynthesis was performed. The images were evaluated with
computer-aided detection.

[R MLO synth-2D]
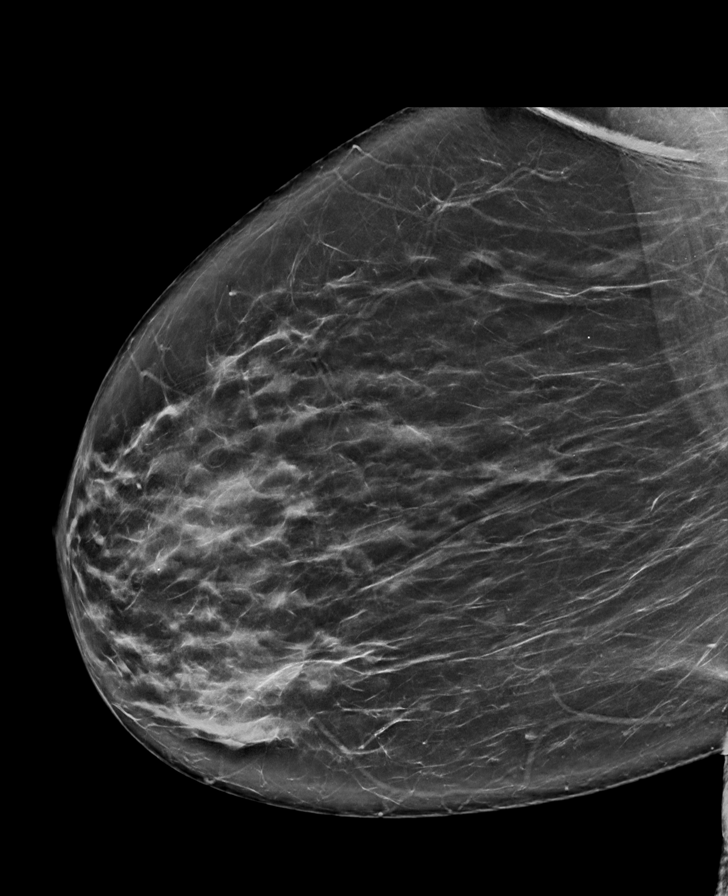

[L CC synth-2D]
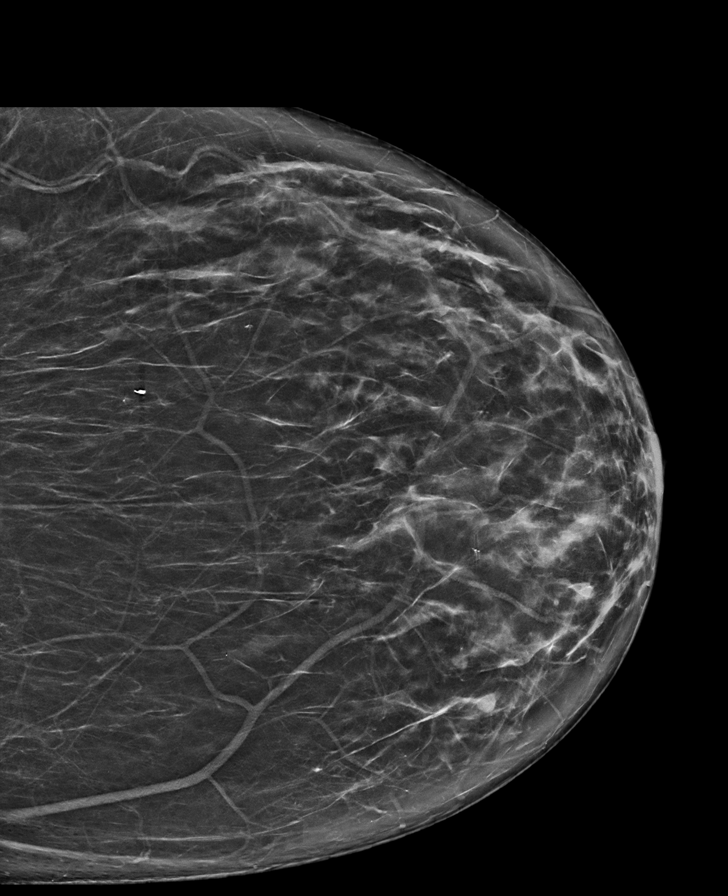

[L MLO synth-2D]
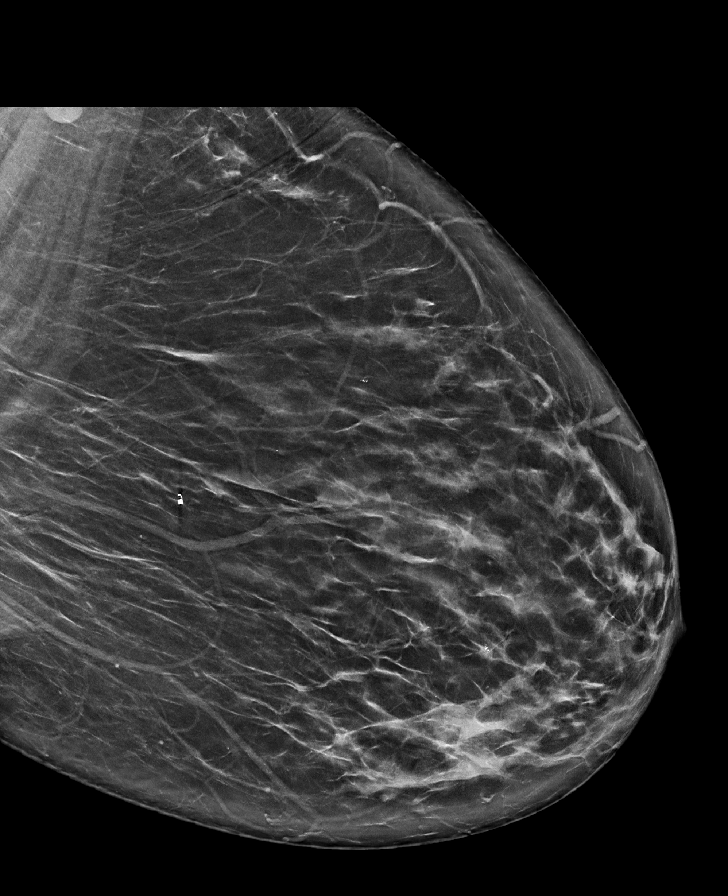

[R CC synth-2D]
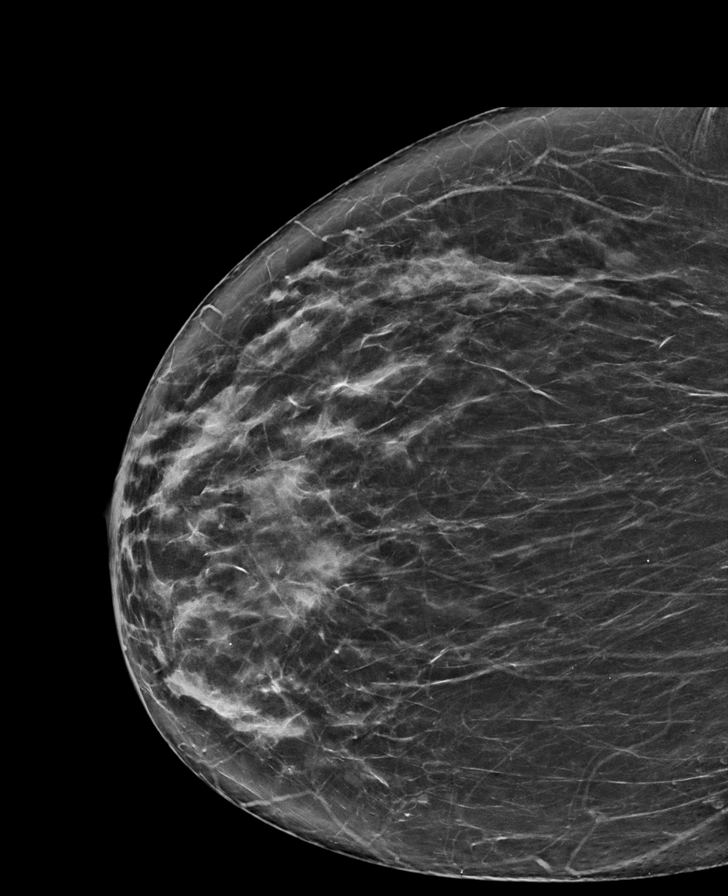

[L MLO tomo · tomo slice 44/87.0]
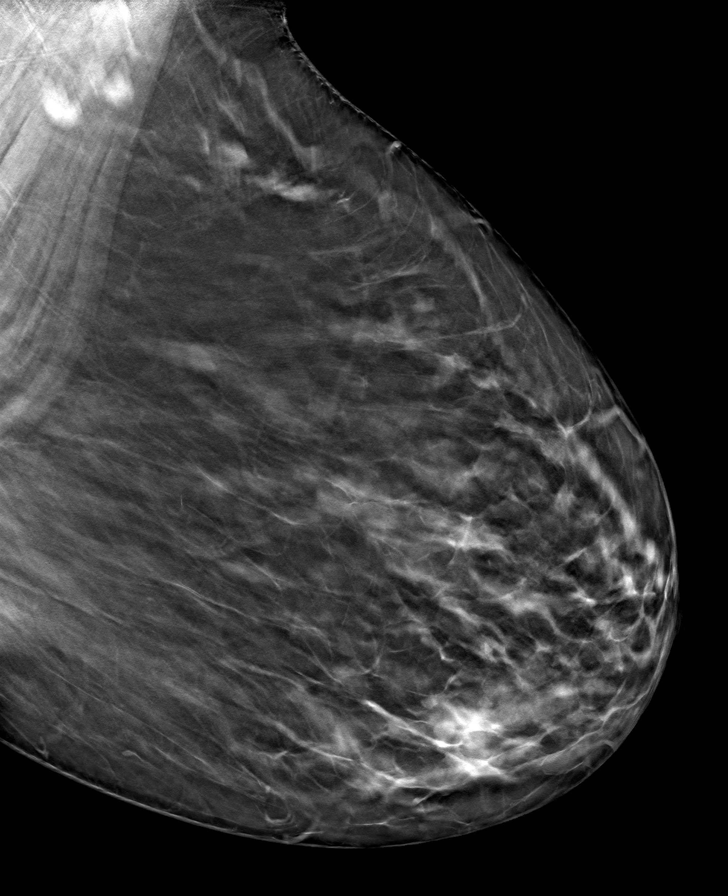

[L CC tomo · tomo slice 40/79.0]
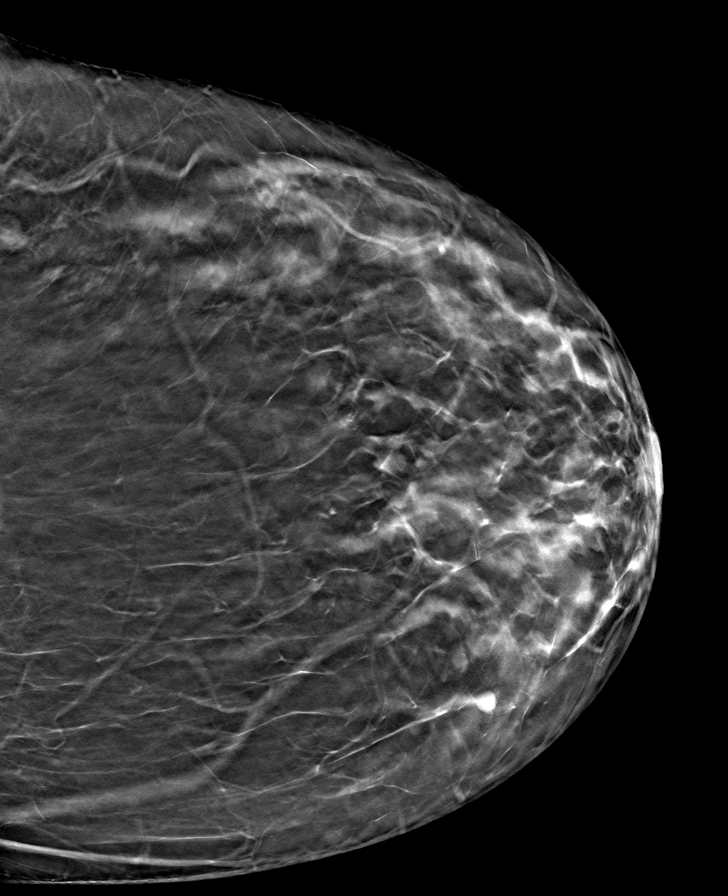

[R MLO tomo · tomo slice 47/92.0]
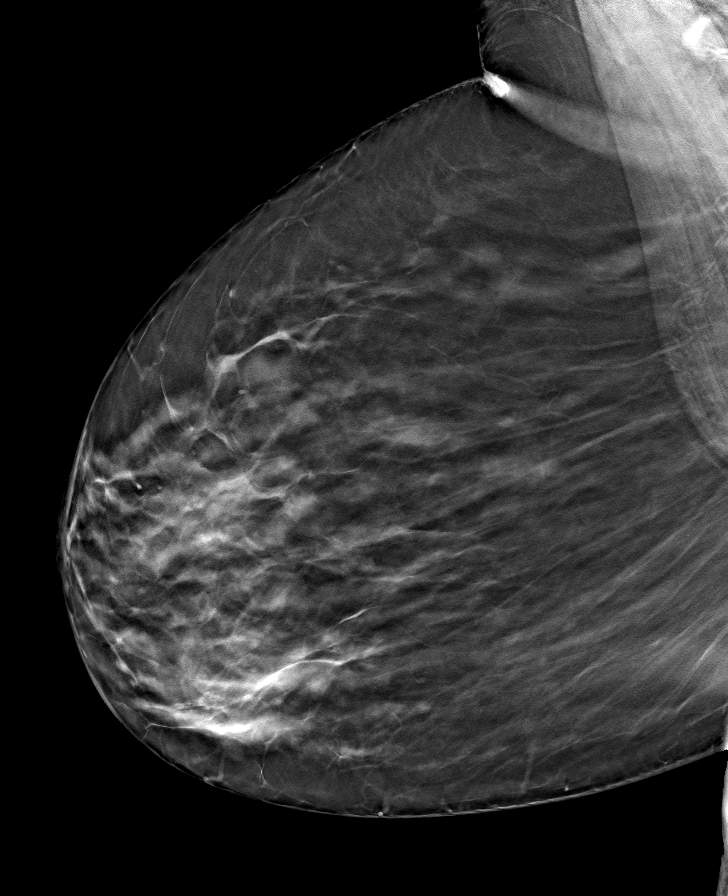

[R CC tomo · tomo slice 42/83.0]
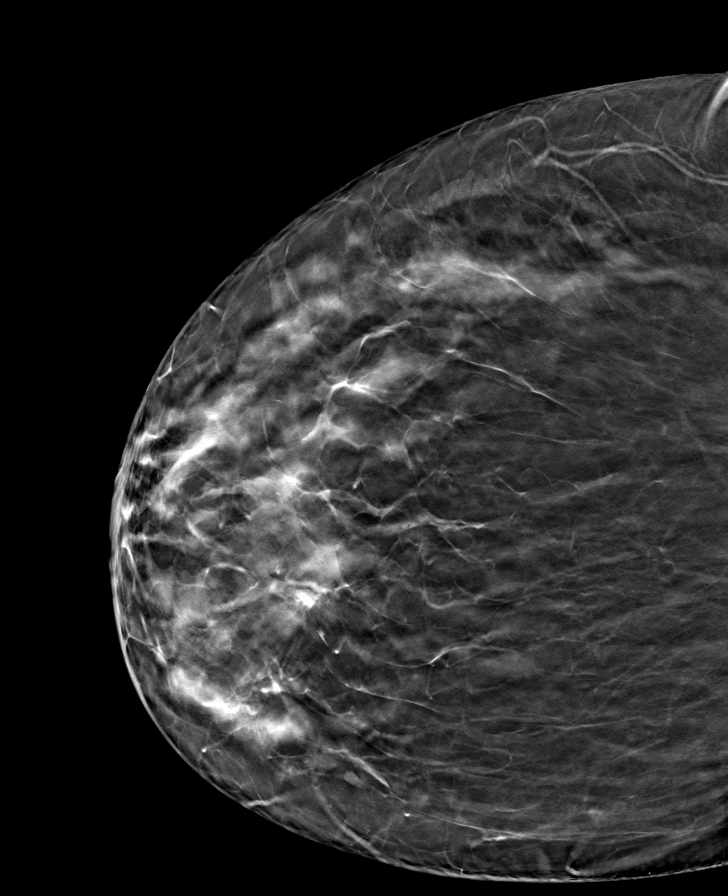

[8 of 24 positions shown; findings below may reference images not displayed]

ACR Breast Density Category b: There are scattered areas of
fibroglandular density.
FINDINGS: There are no findings suspicious for malignancy.
IMPRESSION: No mammographic evidence of malignancy. A result letter of this
screening mammogram will be mailed directly to the patient.

RECOMMENDATION:
Screening mammogram in one year. (Code:51-O-LD2)

BI-RADS CATEGORY  1: Negative.

## 2023-03-02 ENCOUNTER — Ambulatory Visit: Payer: Medicare HMO | Admitting: Podiatry

## 2023-03-30 ENCOUNTER — Ambulatory Visit: Payer: Medicare HMO | Admitting: Podiatry

## 2023-05-08 DIAGNOSIS — E1169 Type 2 diabetes mellitus with other specified complication: Secondary | ICD-10-CM | POA: Diagnosis not present

## 2023-05-08 DIAGNOSIS — E669 Obesity, unspecified: Secondary | ICD-10-CM | POA: Diagnosis not present

## 2023-05-08 DIAGNOSIS — Z0000 Encounter for general adult medical examination without abnormal findings: Secondary | ICD-10-CM | POA: Diagnosis not present

## 2023-05-08 DIAGNOSIS — F4321 Adjustment disorder with depressed mood: Secondary | ICD-10-CM | POA: Diagnosis not present

## 2023-05-08 DIAGNOSIS — I10 Essential (primary) hypertension: Secondary | ICD-10-CM | POA: Diagnosis not present

## 2023-05-08 DIAGNOSIS — Z23 Encounter for immunization: Secondary | ICD-10-CM | POA: Diagnosis not present

## 2023-05-08 DIAGNOSIS — E785 Hyperlipidemia, unspecified: Secondary | ICD-10-CM | POA: Diagnosis not present

## 2023-05-09 ENCOUNTER — Encounter: Payer: Self-pay | Admitting: Podiatry

## 2023-05-09 ENCOUNTER — Ambulatory Visit: Admitting: Podiatry

## 2023-05-09 DIAGNOSIS — B351 Tinea unguium: Secondary | ICD-10-CM | POA: Diagnosis not present

## 2023-05-09 DIAGNOSIS — E1142 Type 2 diabetes mellitus with diabetic polyneuropathy: Secondary | ICD-10-CM

## 2023-05-09 DIAGNOSIS — M79676 Pain in unspecified toe(s): Secondary | ICD-10-CM | POA: Diagnosis not present

## 2023-05-09 NOTE — Progress Notes (Signed)
This patient returns to my office for at risk foot care.  This patient requires this care by a professional since this patient will be at risk due to having diabetes.   This patient is unable to cut nails herself since the patient cannot reach her nails.These nails are painful walking and wearing shoes.  This patient presents for at risk foot care today.  General Appearance  Alert, conversant and in no acute stress.  Vascular  Dorsalis pedis and posterior tibial  pulses are palpable  bilaterally.  Capillary return is within normal limits  bilaterally. Temperature is within normal limits  bilaterally.  Neurologic  Senn-Weinstein monofilament wire test within normal limits  bilaterally. Muscle power within normal limits bilaterally.  Nails Thick disfigured discolored nails with subungual debris  from hallux to fifth toes bilaterally. No evidence of bacterial infection or drainage bilaterally.  Orthopedic  No limitations of motion  feet .  No crepitus or effusions noted.  No bony pathology or digital deformities noted.Plantar fibroma left foot.  Skin  normotropic skin with no porokeratosis noted bilaterally.  No signs of infections or ulcers noted.     Onychomycosis  Pain in right toes  Pain in left toes  Consent was obtained for treatment procedures.   Mechanical debridement of nails 1-5  bilaterally performed with a nail nipper.  Filed with dremel without incident. Dispense diabetic shoes..diabeticshoes   Return office visit 4 months                     Told patient to return for periodic foot care and evaluation due to potential at risk complications.   Helane Gunther DPM

## 2023-06-29 DIAGNOSIS — H524 Presbyopia: Secondary | ICD-10-CM | POA: Diagnosis not present

## 2023-06-29 DIAGNOSIS — H25813 Combined forms of age-related cataract, bilateral: Secondary | ICD-10-CM | POA: Diagnosis not present

## 2023-06-29 DIAGNOSIS — E119 Type 2 diabetes mellitus without complications: Secondary | ICD-10-CM | POA: Diagnosis not present

## 2023-06-29 DIAGNOSIS — H52223 Regular astigmatism, bilateral: Secondary | ICD-10-CM | POA: Diagnosis not present

## 2023-08-25 DIAGNOSIS — E785 Hyperlipidemia, unspecified: Secondary | ICD-10-CM | POA: Diagnosis not present

## 2023-08-25 DIAGNOSIS — E669 Obesity, unspecified: Secondary | ICD-10-CM | POA: Diagnosis not present

## 2023-08-25 DIAGNOSIS — E1169 Type 2 diabetes mellitus with other specified complication: Secondary | ICD-10-CM | POA: Diagnosis not present

## 2023-08-25 DIAGNOSIS — I10 Essential (primary) hypertension: Secondary | ICD-10-CM | POA: Diagnosis not present

## 2023-08-25 DIAGNOSIS — F4321 Adjustment disorder with depressed mood: Secondary | ICD-10-CM | POA: Diagnosis not present

## 2023-11-06 ENCOUNTER — Other Ambulatory Visit: Payer: Self-pay | Admitting: Family Medicine

## 2023-11-06 DIAGNOSIS — Z1231 Encounter for screening mammogram for malignant neoplasm of breast: Secondary | ICD-10-CM

## 2023-11-15 ENCOUNTER — Ambulatory Visit
Admission: RE | Admit: 2023-11-15 | Discharge: 2023-11-15 | Disposition: A | Discharge status: Home / Self Care | Source: Ambulatory Visit | Attending: Family Medicine | Admitting: Family Medicine

## 2023-11-15 DIAGNOSIS — Z1231 Encounter for screening mammogram for malignant neoplasm of breast: Secondary | ICD-10-CM

## 2023-12-12 ENCOUNTER — Ambulatory Visit

## 2023-12-26 DIAGNOSIS — I10 Essential (primary) hypertension: Secondary | ICD-10-CM | POA: Diagnosis not present

## 2023-12-26 DIAGNOSIS — E669 Obesity, unspecified: Secondary | ICD-10-CM | POA: Diagnosis not present

## 2023-12-26 DIAGNOSIS — E1169 Type 2 diabetes mellitus with other specified complication: Secondary | ICD-10-CM | POA: Diagnosis not present

## 2023-12-26 DIAGNOSIS — E785 Hyperlipidemia, unspecified: Secondary | ICD-10-CM | POA: Diagnosis not present

## 2023-12-26 DIAGNOSIS — Z23 Encounter for immunization: Secondary | ICD-10-CM | POA: Diagnosis not present

## 2023-12-26 DIAGNOSIS — F4321 Adjustment disorder with depressed mood: Secondary | ICD-10-CM | POA: Diagnosis not present

## 2024-02-12 ENCOUNTER — Ambulatory Visit (INDEPENDENT_AMBULATORY_CARE_PROVIDER_SITE_OTHER): Admitting: Podiatry

## 2024-02-12 ENCOUNTER — Encounter: Payer: Self-pay | Admitting: Podiatry

## 2024-02-12 DIAGNOSIS — B351 Tinea unguium: Secondary | ICD-10-CM

## 2024-02-12 DIAGNOSIS — M79675 Pain in left toe(s): Secondary | ICD-10-CM | POA: Diagnosis not present

## 2024-02-12 DIAGNOSIS — E1142 Type 2 diabetes mellitus with diabetic polyneuropathy: Secondary | ICD-10-CM

## 2024-02-12 DIAGNOSIS — M79674 Pain in right toe(s): Secondary | ICD-10-CM | POA: Diagnosis not present

## 2024-02-12 NOTE — Progress Notes (Signed)
This patient returns to my office for at risk foot care.  This patient requires this care by a professional since this patient will be at risk due to having diabetes.   This patient is unable to cut nails herself since the patient cannot reach her nails.These nails are painful walking and wearing shoes.  This patient presents for at risk foot care today. ° °General Appearance  Alert, conversant and in no acute stress. ° °Vascular  Dorsalis pedis and posterior tibial  pulses are palpable  bilaterally.  Capillary return is within normal limits  bilaterally. Temperature is within normal limits  bilaterally. ° °Neurologic  Senn-Weinstein monofilament wire test within normal limits  bilaterally. Muscle power within normal limits bilaterally. ° °Nails Thick disfigured discolored nails with subungual debris  from hallux to fifth toes bilaterally. No evidence of bacterial infection or drainage bilaterally. ° °Orthopedic  No limitations of motion  feet .  No crepitus or effusions noted.  No bony pathology or digital deformities noted.Plantar fibroma left foot. ° °Skin  normotropic skin with no porokeratosis noted bilaterally.  No signs of infections or ulcers noted.    ° °Onychomycosis  Pain in right toes  Pain in left toes ° °Consent was obtained for treatment procedures.   Mechanical debridement of nails 1-5  bilaterally performed with a nail nipper.  Filed with dremel without incident.  ° ° °Return office visit 4 months                     Told patient to return for periodic foot care and evaluation due to potential at risk complications. ° ° °Isabel Freese DPM    °

## 2024-06-10 ENCOUNTER — Ambulatory Visit: Admitting: Podiatry
